# Patient Record
Sex: Female | Born: 1985 | Race: White | Hispanic: No | Marital: Married | Smoking: Never smoker
Health system: Southern US, Community
[De-identification: ages and names within clinical notes are randomized; demographics above are authoritative.]

## PROBLEM LIST (undated history)

## (undated) ENCOUNTER — Inpatient Hospital Stay: Payer: Self-pay

## (undated) DIAGNOSIS — IMO0001 Reserved for inherently not codable concepts without codable children: Secondary | ICD-10-CM

## (undated) DIAGNOSIS — I10 Essential (primary) hypertension: Secondary | ICD-10-CM

## (undated) DIAGNOSIS — Q359 Cleft palate, unspecified: Secondary | ICD-10-CM

## (undated) DIAGNOSIS — Z23 Encounter for immunization: Secondary | ICD-10-CM

## (undated) DIAGNOSIS — F952 Tourette's disorder: Secondary | ICD-10-CM

## (undated) DIAGNOSIS — J45909 Unspecified asthma, uncomplicated: Secondary | ICD-10-CM

## (undated) DIAGNOSIS — Z8742 Personal history of other diseases of the female genital tract: Secondary | ICD-10-CM

## (undated) HISTORY — DX: Cleft palate, unspecified: Q35.9

## (undated) HISTORY — DX: Tourette's disorder: F95.2

## (undated) HISTORY — PX: MYRINGOTOMY: SUR874

## (undated) HISTORY — DX: Personal history of other diseases of the female genital tract: Z87.42

## (undated) HISTORY — DX: Reserved for inherently not codable concepts without codable children: IMO0001

## (undated) HISTORY — DX: Encounter for immunization: Z23

## (undated) HISTORY — DX: Unspecified asthma, uncomplicated: J45.909

## (undated) HISTORY — DX: Essential (primary) hypertension: I10

---
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
## 2007-01-28 ENCOUNTER — Emergency Department: Payer: Self-pay | Admitting: Unknown Physician Specialty

## 2007-06-04 ENCOUNTER — Emergency Department: Payer: Self-pay | Admitting: Unknown Physician Specialty

## 2007-10-16 ENCOUNTER — Emergency Department: Payer: Self-pay | Admitting: Internal Medicine

## 2007-11-24 HISTORY — PX: COLPOSCOPY: SHX161

## 2008-02-29 ENCOUNTER — Ambulatory Visit: Payer: Self-pay | Admitting: Family Medicine

## 2008-02-29 DIAGNOSIS — R42 Dizziness and giddiness: Secondary | ICD-10-CM

## 2008-03-01 ENCOUNTER — Encounter: Payer: Self-pay | Admitting: Family Medicine

## 2008-03-07 DIAGNOSIS — F952 Tourette's disorder: Secondary | ICD-10-CM

## 2008-03-21 ENCOUNTER — Ambulatory Visit: Payer: Self-pay | Admitting: Family Medicine

## 2008-03-21 DIAGNOSIS — J069 Acute upper respiratory infection, unspecified: Secondary | ICD-10-CM | POA: Insufficient documentation

## 2008-03-21 LAB — CONVERTED CEMR LAB: Rapid Strep: NEGATIVE

## 2009-01-18 ENCOUNTER — Ambulatory Visit: Payer: Self-pay | Admitting: Internal Medicine

## 2009-01-18 LAB — CONVERTED CEMR LAB: Rapid Strep: NEGATIVE

## 2010-06-03 ENCOUNTER — Ambulatory Visit: Payer: Self-pay | Admitting: Unknown Physician Specialty

## 2010-08-04 ENCOUNTER — Emergency Department: Payer: Self-pay | Admitting: Emergency Medicine

## 2012-07-20 ENCOUNTER — Encounter: Payer: Self-pay | Admitting: Family Medicine

## 2012-07-20 ENCOUNTER — Ambulatory Visit (INDEPENDENT_AMBULATORY_CARE_PROVIDER_SITE_OTHER): Payer: 59 | Admitting: Family Medicine

## 2012-07-20 ENCOUNTER — Ambulatory Visit (INDEPENDENT_AMBULATORY_CARE_PROVIDER_SITE_OTHER)
Admission: RE | Admit: 2012-07-20 | Discharge: 2012-07-20 | Disposition: A | Discharge status: Home / Self Care | Payer: 59 | Source: Ambulatory Visit | Attending: Family Medicine | Admitting: Family Medicine

## 2012-07-20 VITALS — BP 92/40 | HR 60 | Temp 98.4°F | Ht 62.0 in | Wt 163.0 lb

## 2012-07-20 DIAGNOSIS — R0600 Dyspnea, unspecified: Secondary | ICD-10-CM

## 2012-07-20 DIAGNOSIS — R0989 Other specified symptoms and signs involving the circulatory and respiratory systems: Secondary | ICD-10-CM

## 2012-07-20 DIAGNOSIS — R42 Dizziness and giddiness: Secondary | ICD-10-CM

## 2012-07-20 MED ORDER — POCKET SPACER DEVI: Status: DC

## 2012-07-20 MED ORDER — ALBUTEROL SULFATE HFA 108 (90 BASE) MCG/ACT IN AERS: 2.0000 | INHALATION_SPRAY | Freq: Four times a day (QID) | RESPIRATORY_TRACT | Status: DC | PRN

## 2012-07-20 NOTE — Progress Notes (Signed)
  Subjective:    Patient ID: Caroline Fletcher, female    DOB: 12/26/1985, 26 y.o.   MRN: [id number]  HPI CC: SOB  Noticing shortness of breath, trouble taking deep breaths.  Yawning eventually helps.  Affects her more when trying to fall asleep.  Going on for at least the last year, recently getting worse over last 6 months.  Some heaviness in chest.  Occasional dizziness.  Has been on higher dose of clonidine for management of tourette's by neurology since 01/2012. BP Readings from Last 3 Encounters:  07/20/12 92/40  01/18/09 100/70  03/21/08 120/60    No recent viral infections, coughing, chest pain, LOC, leg swelling, HA.  No wheezing.  No GERD or heartburn.  No ST.  Did go to beach last weekend.  No recent travel outside of country.  Not on hormonal meds.   Some second hand exposure. Does not snore.  Denies h/o apneic episodes.  Does feel rested in am.  Activity: walks on treadmill.  No DOE with this. Works at [employer].    [employer] from Last 3 Encounters:  07/20/12 163 lb (73.936 kg)  01/18/09 148 lb (67.132 kg)  03/21/08 142 lb (64.411 kg)   Medications and allergies reviewed and updated in chart.  Past histories reviewed and updated if relevant as below. Patient Active Problem List  Diagnosis  . TOURETTE'S DISORDER (DR. HICKLING)  . URI  . DIZZINESS   Past Medical History  Diagnosis Date  . Tourette's disorder     Dr. Sandria Manly   Past Surgical History  Procedure Date  . Myringotomy     bilateral  . Pallet repair 1990   History  Substance Use Topics  . Smoking status: Never Smoker   . Smokeless tobacco: Never Used  . Alcohol Use: Yes     Occasional   Family History  Problem Relation Age of Onset  . Healthy Father   . Healthy Mother   . Diabetes Maternal Grandfather   . Stroke Paternal Grandmother   . Heart attack Paternal Grandmother   . Cancer Neg Hx    No Known Allergies Current Outpatient Prescriptions on File Prior to Visit  Medication Sig Dispense  Refill  . cloNIDine (CATAPRES) 0.2 MG tablet Take 0.2 mg by mouth 2 (two) times daily.         Review of Systems Per HPI    Objective:   Physical Exam  Nursing note and vitals reviewed. Constitutional: She appears well-developed and well-nourished. No distress.  HENT:  Head: Normocephalic and atraumatic.  Mouth/Throat: Oropharynx is clear and moist. No oropharyngeal exudate.  Eyes: Conjunctivae and EOM are normal. Pupils are equal, round, and reactive to light. No scleral icterus.  Neck: Normal range of motion. Neck supple. Carotid bruit is not present. No thyromegaly present.  Cardiovascular: Normal rate, regular rhythm, normal heart sounds and intact distal pulses.   No murmur heard. Pulmonary/Chest: Effort normal and breath sounds normal. No respiratory distress. She has no wheezes. She has no rales.  Musculoskeletal: She exhibits no edema.  Lymphadenopathy:    She has no cervical adenopathy.  Skin: Skin is warm and dry.  Psychiatric: She has a normal mood and affect.       Assessment & Plan:

## 2012-07-20 NOTE — Assessment & Plan Note (Addendum)
Given longstanding, check CXR today - clear on my read. Will treat as possible reactive airways although no wheezing on exam today - trial of albuterol inhaler with spacer.  Discussed side effects to watch for. No risk factors for PE.  Some second hand exposure but no h/o asthma, exam WNL today, O2 sat 97%. ?contribution of hypotension from clonidine (has been on for 15 years) - recommended decrease to 1 in am and 1/2 in pm.  Has f/u with neuro 09/2012. Denies possibility of pregnancy. No evidence of cardiac etiology. Update if not improving for further evaluation (consider blood work).

## 2012-07-20 NOTE — Assessment & Plan Note (Signed)
Anticipate due to hypotension - decrease clonidine.

## 2012-07-20 NOTE — Patient Instructions (Addendum)
Chest xray today. We will do trial of albuterol with spacer.  Use as needed for shortness of breath. If not improving, let us know for further evaluation. I do think the low blood pressure may be contributing - I'd like [place] to try and back down on clonidine to 1 pill in am and 1/2 pill in evenings. Let us know how your'e doing.

## 2012-08-17 ENCOUNTER — Ambulatory Visit (INDEPENDENT_AMBULATORY_CARE_PROVIDER_SITE_OTHER): Payer: 59 | Admitting: Family Medicine

## 2012-08-17 ENCOUNTER — Encounter: Payer: Self-pay | Admitting: Family Medicine

## 2012-08-17 VITALS — BP 110/70 | HR 68 | Temp 98.0°F | Wt 155.0 lb

## 2012-08-17 DIAGNOSIS — N912 Amenorrhea, unspecified: Secondary | ICD-10-CM

## 2012-08-17 DIAGNOSIS — R55 Syncope and collapse: Secondary | ICD-10-CM

## 2012-08-17 DIAGNOSIS — R42 Dizziness and giddiness: Secondary | ICD-10-CM

## 2012-08-17 LAB — POCT URINE PREGNANCY: Preg Test, Ur: NEGATIVE

## 2012-08-17 NOTE — Patient Instructions (Addendum)
It was great to meet you. We are ordering some blood work today. Your blood pressure is much better with the lower dose of clonidine. We will call you with your lab results as soon as I get them.

## 2012-08-17 NOTE — Progress Notes (Signed)
Subjective:    Patient ID: Caroline Fletcher, female    DOB: December 14, 1985, 26 y.o.   MRN: [id number]  HPI 26 yo pleasant female pt of Dr. B, new to me here for "blacked out."  Felt nauseated and blacked out yesterday at work.  She did not vomit.  She did not hit her head.  It was witnessed by several of her co workers who stated that she immediately woke up but was pale. No seizure activity noted. EMS was called- per pt- BP was 110/70, CBG normal.  Has had not recurrent episodes like this since.  Saw Dr. Reece Agar last month for SOB and dizziness.  Note reviewed. Was hypotensive on clonidine- Dr. Reece Agar decreased dose of clonidine. Had been on higher dose of clonidine for management of tourette's by neurology since 01/2012.  Normotensive today. BP Readings from Last 3 Encounters:  08/17/12 110/70  07/20/12 92/40  01/18/09 100/70    SOB has been occuring mainly at night but that seems to improve.   CXR was neg.  Given albuterol inhaler to see if would help with symptoms.  She has not used this often.  No recent viral infections, NO CP.  No palpitations.  No recent travel.  Not on hormonal meds.   Does not snore.    Saw her neurologist, Dr. Sandria Manly on Monday who recommended Melotinin. She is not sure if this works yet as she has only taken it once.    Wt Readings from Last 3 Encounters:  08/17/12 155 lb (70.308 kg)  07/20/12 163 lb (73.936 kg)  01/18/09 148 lb (67.132 kg)   Patient Active Problem List  Diagnosis  . TOURETTE'S DISORDER (DR. HICKLING)  . DIZZINESS  . Dyspnea   Past Medical History  Diagnosis Date  . Tourette's disorder     Dr. Sandria Manly   Past Surgical History  Procedure Date  . Myringotomy     bilateral  . Pallet repair 1990   History  Substance Use Topics  . Smoking status: Never Smoker   . Smokeless tobacco: Never Used  . Alcohol Use: Yes     Occasional   Family History  Problem Relation Age of Onset  . Healthy Father   . Healthy Mother   . Diabetes Maternal  Grandfather   . Stroke Paternal Grandmother   . Heart attack Paternal Grandmother   . Cancer Neg Hx    No Known Allergies Current Outpatient Prescriptions on File Prior to Visit  Medication Sig Dispense Refill  . albuterol (PROVENTIL HFA;VENTOLIN HFA) 108 (90 BASE) MCG/ACT inhaler Inhale 2 puffs into the lungs every 6 (six) hours as needed for wheezing.  1 Inhaler  2  . cloNIDine (CATAPRES) 0.2 MG tablet Take 0.2 mg by mouth. 1 in am and 1/2 in pm      . Spacer/Aero-Holding Chambers (POCKET SPACER) DEVI As directed  1 each  0    The PMH, PSH, Social History, Family History, Medications, and allergies have been reviewed in [facility], and have been updated if relevant.   Review of Systems Per HPI    Objective:   Physical Exam  BP 110/70  Pulse 68  Temp 98 F (36.7 C)  Wt 155 lb (70.308 kg)  LMP 07/06/2012  Constitutional: She appears well-developed and well-nourished. No distress.  HENT:  Head: Normocephalic and atraumatic.  Mouth/Throat: Oropharynx is clear and moist. No oropharyngeal exudate.  Eyes: Conjunctivae and EOM are normal. Pupils are equal, round, and reactive to light. No scleral icterus.  Neck:  Normal range of motion. Neck supple. Carotid bruit is not present. No thyromegaly present.  Cardiovascular: Normal rate, regular rhythm, normal heart sounds and intact distal pulses.   No murmur heard. Pulmonary/Chest: Effort normal and breath sounds normal. No respiratory distress. She has no wheezes. She has no rales.  Musculoskeletal: She exhibits no edema.  Lymphadenopathy:    She has no cervical adenopathy.  Skin: Skin is warm and dry.  Psychiatric: She has a normal mood and affect.       Assessment & Plan:     1. Syncope  New- one episode yesterday but has had recurrent episodes of dizziness with unclear etiology. Now normotensive. U preg neg and low risk for DVT. SOB improved but still having episodes at night- ? PND EKG NSR with PACs. Will check blood work  today.  If all neg- consider ?anxiety or Holter monitor. The patient indicates understanding of these issues and agrees with the plan.  TSH, T4, Free, CBC with Differential, Comprehensive metabolic panel  2. Amenorrhea  POCT urine pregnancy

## 2012-08-18 LAB — COMPREHENSIVE METABOLIC PANEL
ALT: 11 IU/L (ref 0–32)
Albumin/Globulin Ratio: 1.7 (ref 1.1–2.5)
Calcium: 10.2 mg/dL (ref 8.7–10.2)
Creatinine, Ser: 0.8 mg/dL (ref 0.57–1.00)
GFR calc non Af Amer: 104 mL/min/{1.73_m2} (ref >59)
Glucose: 61 mg/dL — ABNORMAL LOW (ref 65–99)
Potassium: 3.9 mmol/L (ref 3.5–5.2)
Total Protein: 7.3 g/dL (ref 6.0–8.5)

## 2012-08-18 LAB — CBC WITH DIFFERENTIAL/PLATELET
Basos: 1 % (ref 0–3)
Eos: 1 % (ref 0–5)
Eosinophils Absolute: 0.1 10*3/uL (ref 0.0–0.4)
Immature Grans (Abs): 0 10*3/uL (ref 0.0–0.1)
Lymphs: 24 % (ref 14–46)
Neutrophils Relative %: 65 % (ref 40–74)
RBC: 4.5 x10E6/uL (ref 3.77–5.28)
WBC: 7.7 10*3/uL (ref 3.4–10.8)

## 2012-08-18 LAB — T4, FREE: Free T4: 1.22 ng/dL (ref 0.82–1.77)

## 2012-08-18 LAB — TSH: TSH: 2.27 u[IU]/mL (ref 0.450–4.500)

## 2012-11-23 DIAGNOSIS — Z8742 Personal history of other diseases of the female genital tract: Secondary | ICD-10-CM

## 2012-11-23 HISTORY — DX: Personal history of other diseases of the female genital tract: Z87.42

## 2012-11-30 ENCOUNTER — Emergency Department: Payer: Self-pay

## 2012-11-30 LAB — CBC
HCT: 37.1 % (ref 35.0–47.0)
HGB: 12.9 g/dL (ref 12.0–16.0)
Platelet: 289 10*3/uL (ref 150–440)
RBC: 4.41 10*6/uL (ref 3.80–5.20)
RDW: 12.3 % (ref 11.5–14.5)
WBC: 7 10*3/uL (ref 3.6–11.0)

## 2012-11-30 LAB — BASIC METABOLIC PANEL
Calcium, Total: 8.9 mg/dL (ref 8.5–10.1)
Co2: 27 mmol/L (ref 21–32)
EGFR (Non-African Amer.): >60
Glucose: 96 mg/dL (ref 65–99)
Potassium: 3.6 mmol/L (ref 3.5–5.1)

## 2013-03-03 ENCOUNTER — Other Ambulatory Visit: Payer: Self-pay

## 2013-03-03 ENCOUNTER — Encounter: Payer: Self-pay | Admitting: Family Medicine

## 2013-03-03 ENCOUNTER — Ambulatory Visit (INDEPENDENT_AMBULATORY_CARE_PROVIDER_SITE_OTHER): Payer: 59 | Admitting: Family Medicine

## 2013-03-03 VITALS — BP 100/60 | HR 71 | Temp 98.1°F | Ht 62.0 in | Wt 150.0 lb

## 2013-03-03 DIAGNOSIS — F952 Tourette's disorder: Secondary | ICD-10-CM

## 2013-03-03 MED ORDER — CLONAZEPAM 0.5 MG PO TABS: 0.5000 mg | ORAL_TABLET | Freq: Two times a day (BID) | ORAL | Status: DC

## 2013-03-03 NOTE — Telephone Encounter (Signed)
Former [facility] patient calling to request refills on Klonopin.  She has been assigned to Dr Frances Furbish.

## 2013-03-03 NOTE — Assessment & Plan Note (Signed)
Stable on clonazepam and clonidine.  Refilled clonazepam to last until appt with new neurologist.  Discussed stress reduction, relaxation.

## 2013-03-03 NOTE — Progress Notes (Signed)
  Subjective:    Patient ID: Caroline Fletcher, female    DOB: [birth year], 27 y.o.   MRN: [id number]  HPI 27 year old female presents to discuss medication refills. She had Tourette's syndrome that was previously treated with  Clonazepam 0.5 mg twice daily by Neuro Dr. Sandria Manly. He retired 3 weeks ago and she has an appt on May 9th with Dr. Herbie Drape at [facility].  She started on clonazepam 3 months ago when she was seen at [facility].. For Tourette's attack, anxiety, SOB. Negative evaluation. She is also maintained on clonidine.  Since she has been on clonazepam it helps with ticks, SOB and resulting anxiety. She is not having any SE to this med and feels much better overall on it. No depression, No SI.  Last CPX with GYN in last year.   Review of Systems  Constitutional: Negative for fever and fatigue.  HENT: Negative for ear pain.   Eyes: Negative for pain.  Respiratory: Negative for chest tightness and shortness of breath.   Cardiovascular: Negative for chest pain, palpitations and leg swelling.  Gastrointestinal: Negative for abdominal pain.  Genitourinary: Negative for dysuria.       Objective:   Physical Exam  Constitutional: Vital signs are normal. She appears well-developed and well-nourished. She is cooperative.  Non-toxic appearance. She does not appear ill. No distress.  HENT:  Head: Normocephalic.  Right Ear: Hearing, tympanic membrane, external ear and ear canal normal. Tympanic membrane is not erythematous, not retracted and not bulging.  Left Ear: Hearing, tympanic membrane, external ear and ear canal normal. Tympanic membrane is not erythematous, not retracted and not bulging.  Nose: No mucosal edema or rhinorrhea. Right sinus exhibits no maxillary sinus tenderness and no frontal sinus tenderness. Left sinus exhibits no maxillary sinus tenderness and no frontal sinus tenderness.  Mouth/Throat: Uvula is midline, oropharynx is clear and moist and mucous membranes are normal.  Eyes:  Conjunctivae, EOM and lids are normal. Pupils are equal, round, and reactive to light. No foreign bodies found.  Neck: Trachea normal and normal range of motion. Neck supple. Carotid bruit is not present. No mass and no thyromegaly present.  Cardiovascular: Normal rate, regular rhythm, S1 normal, S2 normal, normal heart sounds, intact distal pulses and normal pulses.  Exam reveals no gallop and no friction rub.   No murmur heard. Pulmonary/Chest: Effort normal and breath sounds normal. Not tachypneic. No respiratory distress. She has no decreased breath sounds. She has no wheezes. She has no rhonchi. She has no rales.  Abdominal: Soft. Normal appearance and bowel sounds are normal. There is no tenderness.  Neurological: She is alert.  Skin: Skin is warm, dry and intact. No rash noted.  Psychiatric: Her speech is normal and behavior is normal. Judgment and thought content normal. Her mood appears not anxious. Cognition and memory are normal. She does not exhibit a depressed mood.          Assessment & Plan:

## 2013-03-03 NOTE — Patient Instructions (Signed)
Keep appointment as planned with [facility] Neurologist.

## 2013-03-07 NOTE — Telephone Encounter (Signed)
It appears Dr Pattricia Boss ordered #60 Klonopin on 03/03/13

## 2013-04-20 ENCOUNTER — Encounter: Payer: Self-pay | Admitting: Family Medicine

## 2013-04-20 ENCOUNTER — Ambulatory Visit (INDEPENDENT_AMBULATORY_CARE_PROVIDER_SITE_OTHER): Payer: 59 | Admitting: Family Medicine

## 2013-04-20 VITALS — BP 120/60 | HR 80 | Temp 98.2°F | Ht 62.0 in | Wt 146.0 lb

## 2013-04-20 DIAGNOSIS — J020 Streptococcal pharyngitis: Secondary | ICD-10-CM

## 2013-04-20 DIAGNOSIS — J029 Acute pharyngitis, unspecified: Secondary | ICD-10-CM

## 2013-04-20 LAB — POCT RAPID STREP A (OFFICE): Rapid Strep A Screen: POSITIVE — AB

## 2013-04-20 MED ORDER — AMOXICILLIN 875 MG PO TABS: 875.0000 mg | ORAL_TABLET | Freq: Two times a day (BID) | ORAL | Status: DC

## 2013-04-20 NOTE — Progress Notes (Signed)
    Nature conservation officer at [facility] [address] [place] [address] Phone: [phone] Fax: [phone]  Date:  04/20/2013   Name:  Caroline Fletcher   DOB:  May 07, 1986   MRN:  [id number] Gender: female Age: 27 y.o.  PCP:  Kerby Nora, MD  Evaluating MD: Hannah Beat, MD  History of Present Illness:  Patient presents with sore throat for 3 days. Subjective fevers, achiness, headache. Some nausea. No significant URI sx. No significant cough.  The PMH, PSH, Social History, Family History, Medications, and allergies have been reviewed in [facility], and have been updated if relevant.   ROS: GEN: Acute illness details above GI: Tolerating PO intake GU: maintaining adequate hydration and urination Pulm: No SOB Interactive and getting along well at home.  Otherwise, ROS is as per the HPI.   Physical Exam  Filed Vitals:   04/20/13 1200  BP: 120/60  Pulse: 80  Temp: 98.2 F (36.8 C)  TempSrc: Oral  Height: 5\' 2"  (1.575 m)  Weight: 146 lb (66.225 kg)  SpO2: 97%    Gen: WDWN, NAD; A & O x3, cooperative. Pleasant.Globally Non-toxic HEENT: Normocephalic and atraumatic. Throat: swollen tonsills with exudate R TM clear, L TM - good landmarks, No fluid present. rhinnorhea. No frontal or maxillary sinus T. MMM NECK: Anterior cervical  LAD is present - TTP CV: RRR, No M/G/R, cap refill <2 sec PULM: Breathing comfortably in no respiratory distress. no wheezing, crackles, rhonchi EXT: No c/c/e PSYCH: Friendly, good eye contact MSK: Nml gait   A/P: Strep Throat Strep test + Treat with ABX Supportive care

## 2013-06-14 ENCOUNTER — Ambulatory Visit (INDEPENDENT_AMBULATORY_CARE_PROVIDER_SITE_OTHER): Payer: 59 | Admitting: Family Medicine

## 2013-06-14 ENCOUNTER — Encounter: Payer: Self-pay | Admitting: Family Medicine

## 2013-06-14 VITALS — BP 100/60 | HR 68 | Temp 97.9°F | Ht 62.0 in | Wt 150.0 lb

## 2013-06-14 DIAGNOSIS — J45909 Unspecified asthma, uncomplicated: Secondary | ICD-10-CM

## 2013-06-14 DIAGNOSIS — J4540 Moderate persistent asthma, uncomplicated: Secondary | ICD-10-CM

## 2013-06-14 DIAGNOSIS — R0602 Shortness of breath: Secondary | ICD-10-CM

## 2013-06-14 MED ORDER — MOMETASONE FUROATE 220 MCG/INH IN AEPB: 1.0000 | INHALATION_SPRAY | Freq: Every day | RESPIRATORY_TRACT | Status: DC

## 2013-06-14 NOTE — Patient Instructions (Signed)
F/u 4-6 weeks with Dr. Patsy Lager

## 2013-06-14 NOTE — Progress Notes (Signed)
Nature conservation officer at [facility] [address] [place] [address] Phone: [phone] Fax: [phone]  Date:  06/14/2013   Name:  Prince Solian   DOB:  August 08, 1986   MRN:  [id number] Gender: female Age: 27 y.o.  Primary Physician:  Kerby Nora, MD  Evaluating MD: Hannah Beat, MD   Chief Complaint: Cough and Shortness of Breath   History of Present Illness:  Prince Solian is a 27 y.o. pleasant patient who presents with the following:  Pleasant young lady with Tourette's:  Heavy chest - a year ago, and will get an inhaler. Feels like cannot breath well. The albuterol and spacer will help a little bit.  Works at [employer].  No smoking.  She feels short of breath and has difficulty breathing with exercise, going up and down stairs, when it is damp, when it is really hot.   Patient Active Problem List   Diagnosis Date Noted  . Syncope 08/17/2012  . Dyspnea 07/20/2012  . TOURETTE'S DISORDER (DR. HICKLING) 03/07/2008  . DIZZINESS 02/29/2008    Past Medical History  Diagnosis Date  . Tourette's disorder     Dr. Sandria Manly    Past Surgical History  Procedure Laterality Date  . Myringotomy      bilateral  . Pallet repair  1990    History   Social History  . Marital Status: Single    Spouse Name: N/A    Number of Children: N/A  . Years of Education: N/A   Occupational History  . Not on file.   Social History Main Topics  . Smoking status: Never Smoker   . Smokeless tobacco: Never Used  . Alcohol Use: Yes     Comment: Occasional  . Drug Use: No  . Sexually Active: Not on file   Other Topics Concern  . Not on file   Social History Narrative  . No narrative on file    Family History  Problem Relation Age of Onset  . Healthy Father   . Healthy Mother   . Diabetes Maternal Grandfather   . Stroke Paternal Grandmother   . Heart attack Paternal Grandmother   . Cancer Neg Hx     No Known Allergies  Medication list has been reviewed and  updated.  Outpatient Prescriptions Prior to Visit  Medication Sig Dispense Refill  . clonazePAM (KLONOPIN) 0.5 MG tablet Take 1 tablet (0.5 mg total) by mouth 2 (two) times daily.  60 tablet  0  . cloNIDine (CATAPRES) 0.2 MG tablet Take 0.2 mg by mouth. 1 in am and 1/2 in pm      . albuterol (PROVENTIL HFA;VENTOLIN HFA) 108 (90 BASE) MCG/ACT inhaler Inhale 2 puffs into the lungs every 6 (six) hours as needed for wheezing.  1 Inhaler  2  . amoxicillin (AMOXIL) 875 MG tablet Take 1 tablet (875 mg total) by mouth 2 (two) times daily.  20 tablet  0  . Spacer/Aero-Holding Chambers (POCKET SPACER) DEVI As directed  1 each  0   No facility-administered medications prior to visit.    Review of Systems:  Dyspnea and SOB as above. No reflux / heartburn appreciable. No fever, chills, sweats.  Physical Examination: BP 100/60  Pulse 68  Temp(Src) 97.9 F (36.6 C) (Oral)  Ht 5\' 2"  (1.575 m)  Wt 150 lb (68.04 kg)  BMI 27.43 kg/m2  SpO2 98%  Ideal Body Weight: Weight in (lb) to have BMI = 25: 136.4   GEN: WDWN, NAD, Non-toxic, A & O  x 3 HEENT: Atraumatic, Normocephalic. Neck supple. No masses, No LAD. Ears and Nose: No external deformity. CV: RRR, No M/G/R. No JVD. No thrill. No extra heart sounds. PULM: CTA B, no wheezes, crackles, rhonchi. No retractions. No resp. distress. No accessory muscle use. EXTR: No c/c/e NEURO Normal gait.  PSYCH: Normally interactive. Conversant. Not depressed or anxious appearing.  Calm demeanor.    Spirometry: Pre: FVC: 3.00 FEV1: 2.47 FEV1/FVC: 83% FEF 25-75: 2.62  Post-albuterol: FVC: 3.04 FEV1: 2.8 FEV1/FVC: 92% FEF 25-75: 3.65  Assessment and Plan:  SOB (shortness of breath) - Plan: Spirometry w/ graph, Spirometry w/ graph  Asthma, chronic, moderate persistent, uncomplicated  C/w asthma clinically, and multiple aspects of spirometry improve with bronchodilator, c/w asthma.  Start low-dose asmanex and f/u in 1 moonth.  Orders Today:    Orders Placed This Encounter  Procedures  . Spirometry w/ graph    Standing Status: Future     Number of Occurrences: 1     Standing Expiration Date: 06/14/2014    Updated Medication List: (Includes new medications, updates to list, dose adjustments) Meds ordered this encounter  Medications  . mometasone (ASMANEX 60 METERED DOSES) 220 MCG/INH inhaler    Sig: Inhale 1 puff into the lungs daily.    Dispense:  1 Inhaler    Refill:  5    Medications Discontinued: Medications Discontinued During This Encounter  Medication Reason  . albuterol (PROVENTIL HFA;VENTOLIN HFA) 108 (90 BASE) MCG/ACT inhaler Error  . amoxicillin (AMOXIL) 875 MG tablet Error  . Spacer/Aero-Holding Chambers (POCKET [employer]) DEVI Error      Signed, Karleen Hampshire T. Jabez Molner, MD 06/14/2013 12:21 PM

## 2013-06-15 ENCOUNTER — Encounter: Payer: Self-pay | Admitting: Family Medicine

## 2013-06-15 DIAGNOSIS — J45909 Unspecified asthma, uncomplicated: Secondary | ICD-10-CM

## 2013-06-15 HISTORY — DX: Unspecified asthma, uncomplicated: J45.909

## 2013-07-19 ENCOUNTER — Ambulatory Visit: Payer: 59 | Admitting: Family Medicine

## 2013-07-19 DIAGNOSIS — Z0289 Encounter for other administrative examinations: Secondary | ICD-10-CM

## 2013-07-25 ENCOUNTER — Other Ambulatory Visit: Payer: Self-pay | Admitting: Family Medicine

## 2013-07-25 NOTE — Telephone Encounter (Signed)
Is Nehemiah Settle suppose to be on Proventil and Asamanex?  Proventil is no longer on her medication list. Last OV 06/14/2013 Ok to refill?

## 2013-07-26 NOTE — Telephone Encounter (Signed)
Yes, 1 albuterol inhaler, 2 puffs every 4 hours as needed. #3 refills

## 2013-10-25 ENCOUNTER — Ambulatory Visit (INDEPENDENT_AMBULATORY_CARE_PROVIDER_SITE_OTHER): Payer: 59 | Admitting: Family Medicine

## 2013-10-25 VITALS — BP 110/70 | HR 67 | Temp 98.5°F | Ht 62.0 in | Wt 162.5 lb

## 2013-10-25 DIAGNOSIS — J45909 Unspecified asthma, uncomplicated: Secondary | ICD-10-CM

## 2013-10-25 DIAGNOSIS — F952 Tourette's disorder: Secondary | ICD-10-CM

## 2013-10-25 DIAGNOSIS — J209 Acute bronchitis, unspecified: Secondary | ICD-10-CM

## 2013-10-25 MED ORDER — DOXYCYCLINE HYCLATE 100 MG PO TABS: 100.0000 mg | ORAL_TABLET | Freq: Two times a day (BID) | ORAL | Status: DC

## 2013-10-25 NOTE — Progress Notes (Signed)
Pre-visit discussion using our clinic review tool. No additional management support is needed unless otherwise documented below in the visit note.  

## 2013-10-25 NOTE — Progress Notes (Signed)
Patient Name: Caroline Fletcher Date of Birth: 10-14-1986 Medical Record Number: [id number]  History of Present Illness:  Patent presents with runny nose, sneezing, cough, sore throat, malaise and minimal / low-grade fever .  Cold-like sx, coughing, sneezing. Head congestion. No wheezing.   Off Klonopin. Sees Dr. Ivette Loyal and [place]. Her ticks have gotten significantly worse since she has been sick.  ? recent exposure to others with similar symptoms.   The patent denies sore throat as the primary complaint. Denies sthortness of breath/wheezing, high fever, chest pain, rhinits for more than 14 days, significant myalgia, otalgia, facial pain, abdominal pain, changes in bowel or bladder.  PMH, PHS, Allergies, Problem List, Medications, Family History, and Social History have all been reviewed.  Patient Active Problem List   Diagnosis Date Noted  . Asthma, chronic 06/15/2013  . TOURETTE'S DISORDER ([facility]) 03/07/2008    Past Medical History  Diagnosis Date  . Tourette's disorder     Dr. Sandria Manly  . Asthma, chronic 06/15/2013    Past Surgical History  Procedure Laterality Date  . Myringotomy      bilateral  . Pallet repair  1990    History   Social History  . Marital Status: Single    Spouse Name: N/A    Number of Children: N/A  . Years of Education: N/A   Occupational History  . Not on file.   Social History Main Topics  . Smoking status: Never Smoker   . Smokeless tobacco: Never Used  . Alcohol Use: Yes     Comment: Occasional  . Drug Use: No  . Sexual Activity: Not on file   Other Topics Concern  . Not on file   Social History Narrative  . No narrative on file    Family History  Problem Relation Age of Onset  . Healthy Father   . Healthy Mother   . Diabetes Maternal Grandfather   . Stroke Paternal Grandmother   . Heart attack Paternal Grandmother   . Cancer Neg Hx     No Known Allergies  Medication list reviewed and updated in full in [facility].  Review of Systems: as above, eating and drinking - tolerating PO. Urinating normally. No excessive vomitting or diarrhea. O/w as above.  Physical Exam:  Filed Vitals:   10/25/13 1140  BP: 110/70  Pulse: 67  Temp: 98.5 F (36.9 C)  TempSrc: Oral  Height: 5\' 2"  (1.575 m)  Weight: 162 lb 8 oz (73.71 kg)    GEN: WDWN, Non-toxic, Atraumatic, normocephalic. A and O x 3. HEENT: Oropharynx clear without exudate, MMM, no significant LAD, mild rhinnorhea Ears: TM clear, COL visualized with good landmarks CV: RRR, no m/g/r. Pulm: CTA B, no wheezes, rhonchi, or crackles, normal respiratory effort. EXT: no c/c/e Psych: well oriented, neither depressed nor anxious in appearance  Objective Data:   Acute bronchitis  TOURETTE'S DISORDER ([facility])  Asthma, chronic  Is difficult to tell if this is a viral or bacterial pathogen. In this patient with asthma and worsening Tourette's, think that the potential benefit outweigh the risks,, place her on some doxycycline.  New medications, updates to list, dose adjustments: Meds ordered this encounter  Medications  . tetrabenazine (XENAZINE) 12.5 MG tablet    Sig: Take 12.5 mg by mouth 2 (two) times daily.  Marland Kitchen doxycycline (VIBRA-TABS) 100 MG tablet    Sig: Take 1 tablet (100 mg total) by mouth 2 (two) times daily.    Dispense:  20 tablet  Refill:  0    Signed,  Chandria Rookstool T. Madisan Bice, MD, CAQ Sports Medicine  [facility] [address] [place] [address] Phone: [phone] Fax: [phone]  Updated Complete Medication List:   Medication List       This list is accurate as of: 10/25/13 11:59 PM.  Always use your most recent med list.               albuterol 108 (90 BASE) MCG/ACT inhaler  Commonly known as:  PROVENTIL HFA  Inhale 2 puffs into the lungs every 4 (four) hours as needed for wheezing.     cloNIDine 0.2 MG tablet  Commonly known as:  CATAPRES  Take 0.2 mg by mouth. 1 in  am and 1/2 in pm     doxycycline 100 MG tablet  Commonly known as:  VIBRA-TABS  Take 1 tablet (100 mg total) by mouth 2 (two) times daily.     mometasone 220 MCG/INH inhaler  Commonly known as:  ASMANEX 60 METERED DOSES  Inhale 1 puff into the lungs daily.     tetrabenazine 12.5 MG tablet  Commonly known as:  XENAZINE  Take 12.5 mg by mouth 2 (two) times daily.

## 2013-12-11 ENCOUNTER — Ambulatory Visit (INDEPENDENT_AMBULATORY_CARE_PROVIDER_SITE_OTHER): Payer: 59 | Admitting: Internal Medicine

## 2013-12-11 ENCOUNTER — Encounter: Payer: Self-pay | Admitting: Internal Medicine

## 2013-12-11 VITALS — BP 110/60 | HR 81 | Temp 98.2°F | Wt 161.0 lb

## 2013-12-11 DIAGNOSIS — M549 Dorsalgia, unspecified: Secondary | ICD-10-CM

## 2013-12-11 DIAGNOSIS — R109 Unspecified abdominal pain: Secondary | ICD-10-CM | POA: Insufficient documentation

## 2013-12-11 LAB — POCT URINALYSIS DIPSTICK
Bilirubin, UA: NEGATIVE
Glucose, UA: NEGATIVE
Ketones, UA: NEGATIVE
LEUKOCYTES UA: NEGATIVE
NITRITE UA: NEGATIVE
PROTEIN UA: NEGATIVE
UROBILINOGEN UA: NEGATIVE
pH, UA: 7

## 2013-12-11 NOTE — Patient Instructions (Signed)
Please try heat on the painful area to see if that improves things

## 2013-12-11 NOTE — Assessment & Plan Note (Signed)
Not UTI or cystitis Not GI  Discussed muscular etiology Can try heat, etc

## 2013-12-11 NOTE — Progress Notes (Signed)
   Subjective:    Patient ID: Caroline Fletcher, female    DOB: September 28, 1986, 28 y.o.   MRN: [id number]  HPI Having low back pain for 2 weeks Notices it more at night Wondering about her kidneys --gets urinary urgency No increased frequency No hematuria or burning dysuria  Doesn't feel like a back injury Like "a grab"  Hasn't taken anything for it No history of kidney stone  Current Outpatient Prescriptions on File Prior to Visit  Medication Sig Dispense Refill  . albuterol (PROVENTIL HFA) 108 (90 BASE) MCG/ACT inhaler Inhale 2 puffs into the lungs every 4 (four) hours as needed for wheezing.  8.5 g  3  . cloNIDine (CATAPRES) 0.2 MG tablet Take 0.2 mg by mouth. 1 in am and 1/2 in pm      . tetrabenazine (XENAZINE) 12.5 MG tablet Take 12.5 mg by mouth 2 (two) times daily.       No current facility-administered medications on file prior to visit.    No Known Allergies  Past Medical History  Diagnosis Date  . Tourette's disorder     Dr. Sandria ManlyLove  . Asthma, chronic 06/15/2013    Past Surgical History  Procedure Laterality Date  . Myringotomy      bilateral  . Pallet repair  1990    Family History  Problem Relation Age of Onset  . Healthy Father   . Healthy Mother   . Diabetes Maternal Grandfather   . Stroke Paternal Grandmother   . Heart attack Paternal Grandmother   . Cancer Neg Hx     History   Social History  . Marital Status: Single    Spouse Name: N/A    Number of Children: N/A  . Years of Education: N/A   Occupational History  . Not on file.   Social History Main Topics  . Smoking status: Never Smoker   . Smokeless tobacco: Never Used  . Alcohol Use: Yes     Comment: Occasional  . Drug Use: No  . Sexual Activity: Not on file   Other Topics Concern  . Not on file   Social History Narrative  . No narrative on file   Review of Systems No fver UTI's in past--never with this symptom On period now Appetite is fine Bowels are okay    Objective:   Physical Exam  Constitutional: She appears well-developed and well-nourished. No distress.  Abdominal: Soft. She exhibits no distension. There is no tenderness. There is no rebound and no guarding.  Musculoskeletal:  No spine tenderness Very slight right flank pain Normal ROM in hips SLR negative bilaterally  Neurological:  Normal gait and strength           Assessment & Plan:

## 2013-12-11 NOTE — Progress Notes (Signed)
Pre-visit discussion using our clinic review tool. No additional management support is needed unless otherwise documented below in the visit note.  

## 2014-01-10 ENCOUNTER — Emergency Department: Payer: Self-pay | Admitting: Emergency Medicine

## 2014-03-26 ENCOUNTER — Encounter: Payer: Self-pay | Admitting: Maternal & Fetal Medicine

## 2014-05-10 ENCOUNTER — Emergency Department: Payer: Self-pay

## 2014-05-10 LAB — COMPREHENSIVE METABOLIC PANEL
ANION GAP: 8 (ref 7–16)
Albumin: 3.6 g/dL (ref 3.4–5.0)
Alkaline Phosphatase: 42 U/L — ABNORMAL LOW
BILIRUBIN TOTAL: 0.3 mg/dL (ref 0.2–1.0)
BUN: 7 mg/dL (ref 7–18)
Calcium, Total: 9.8 mg/dL (ref 8.5–10.1)
Chloride: 102 mmol/L (ref 98–107)
Co2: 25 mmol/L (ref 21–32)
Creatinine: 0.57 mg/dL — ABNORMAL LOW (ref 0.60–1.30)
EGFR (African American): >60
EGFR (Non-African Amer.): >60
GLUCOSE: 73 mg/dL (ref 65–99)
Osmolality: 267 (ref 275–301)
POTASSIUM: 4.2 mmol/L (ref 3.5–5.1)
SGOT(AST): 10 U/L — ABNORMAL LOW (ref 15–37)
SGPT (ALT): 23 U/L (ref 12–78)
SODIUM: 135 mmol/L — AB (ref 136–145)
TOTAL PROTEIN: 8.1 g/dL (ref 6.4–8.2)

## 2014-05-10 LAB — CBC
HCT: 37.9 % (ref 35.0–47.0)
HGB: 13 g/dL (ref 12.0–16.0)
MCH: 28.8 pg (ref 26.0–34.0)
MCHC: 34.3 g/dL (ref 32.0–36.0)
MCV: 84 fL (ref 80–100)
Platelet: 275 10*3/uL (ref 150–440)
RBC: 4.52 10*6/uL (ref 3.80–5.20)
RDW: 12.5 % (ref 11.5–14.5)
WBC: 13.6 10*3/uL — AB (ref 3.6–11.0)

## 2014-06-04 ENCOUNTER — Encounter: Payer: Self-pay | Admitting: Maternal & Fetal Medicine

## 2014-06-18 ENCOUNTER — Encounter: Payer: Self-pay | Admitting: Maternal & Fetal Medicine

## 2014-09-17 ENCOUNTER — Encounter: Payer: Self-pay | Admitting: Maternal & Fetal Medicine

## 2014-10-01 ENCOUNTER — Observation Stay: Payer: Self-pay

## 2014-10-03 ENCOUNTER — Ambulatory Visit (INDEPENDENT_AMBULATORY_CARE_PROVIDER_SITE_OTHER): Payer: 59 | Admitting: Family Medicine

## 2014-10-03 ENCOUNTER — Encounter: Payer: Self-pay | Admitting: Family Medicine

## 2014-10-03 VITALS — BP 108/70 | HR 95 | Temp 98.2°F | Ht 62.0 in | Wt 203.2 lb

## 2014-10-03 DIAGNOSIS — H6592 Unspecified nonsuppurative otitis media, left ear: Secondary | ICD-10-CM

## 2014-10-03 DIAGNOSIS — Z3483 Encounter for supervision of other normal pregnancy, third trimester: Secondary | ICD-10-CM

## 2014-10-03 DIAGNOSIS — Z3493 Encounter for supervision of normal pregnancy, unspecified, third trimester: Secondary | ICD-10-CM

## 2014-10-03 DIAGNOSIS — H6692 Otitis media, unspecified, left ear: Secondary | ICD-10-CM

## 2014-10-03 MED ORDER — AMOXICILLIN 500 MG PO CAPS: 1000.0000 mg | ORAL_CAPSULE | Freq: Two times a day (BID) | ORAL | Status: DC

## 2014-10-03 NOTE — Progress Notes (Signed)
Pre visit review using our clinic review tool, if applicable. No additional management support is needed unless otherwise documented below in the visit note. 

## 2014-10-03 NOTE — Progress Notes (Signed)
   Dr. Karleen HampshireSpencer T. Ishmail Mcmanamon, MD, CAQ Sports Medicine Primary Care and Sports Medicine [address] [place], [id number] Phone: [phone] Fax: [phone]  10/03/2014  Patient: Caroline Fletcher, MRN: [id number], DOB: 14-Feb-1986, 28 y.o.  Primary Physician:  Hannah BeatSpencer Shoji Pertuit, MD  Chief Complaint: Otitis Media  Subjective:   Caroline Fletcher is a 28 y.o. very pleasant female patient who presents with the following:  Left otitis media, hurts during the day and at night. This is been ongoing for 3 days. She has not had much significant URI type symptoms. The patient is pregnant and she is [redacted] weeks pregnant currently. She also has to wisdom teeth are coming in, including one on the left upper.  Past Medical History, Surgical History, Social History, Family History, Problem List, Medications, and Allergies have been reviewed and updated if relevant.  ROS: GEN: Acute illness details above GI: Tolerating PO intake GU: maintaining adequate hydration and urination Pulm: No SOB Interactive and getting along well at home.  Otherwise, ROS is as per the HPI.   Objective:   BP 108/70 mmHg  Pulse 95  Temp(Src) 98.2 F (36.8 C) (Oral)  Ht 5\' 2"  (1.575 m)  Wt 203 lb 4 oz (92.194 kg)  BMI 37.17 kg/m2   Gen: WDWN, NAD; A & O x3, cooperative. Pleasant.Globally Non-toxic HEENT: Normocephalic and atraumatic. Throat clear, w/o exudate, R TM clear, L TM - poor landmarks, + fluid present. No rhinnorhea.  MMM Frontal sinuses: NT Max sinuses: NT NECK: Anterior cervical  LAD is absent CV: RRR, No M/G/R, cap refill <2 sec PULM: Breathing comfortably in no respiratory distress. no wheezing, crackles, rhonchi EXT: No c/c/e PSYCH: Friendly, good eye contact MSK: Nml gait     Laboratory and Imaging Data:  Assessment and Plan:   Left otitis media with effusion  Pregnant and not yet delivered in third trimester  The patient's wisdom tooth may be contributing somewhat to the  pain also. We will treat for otitis media.  Follow-up: No Follow-up on file.  New Prescriptions   AMOXICILLIN (AMOXIL) 500 MG CAPSULE    Take 2 capsules (1,000 mg total) by mouth 2 (two) times daily.   No orders of the defined types were placed in this encounter.    Signed,  Elpidio GaleaSpencer T. Kazuko Clemence, MD   Patient's Medications  New Prescriptions   AMOXICILLIN (AMOXIL) 500 MG CAPSULE    Take 2 capsules (1,000 mg total) by mouth 2 (two) times daily.  Previous Medications   ALBUTEROL (PROVENTIL HFA) 108 (90 BASE) MCG/ACT INHALER    Inhale 2 puffs into the lungs every 4 (four) hours as needed for wheezing.   ARIPIPRAZOLE (ABILIFY) 5 MG TABLET    Take 5 mg by mouth daily.    CLONIDINE (CATAPRES) 0.2 MG TABLET    Take 0.2 mg by mouth. 1 in am and 1/2 in pm  Modified Medications   No medications on file  Discontinued Medications   TETRABENAZINE (XENAZINE) 12.5 MG TABLET    Take 12.5 mg by mouth 2 (two) times daily.

## 2014-10-15 ENCOUNTER — Encounter: Payer: Self-pay | Admitting: Maternal & Fetal Medicine

## 2014-10-29 ENCOUNTER — Inpatient Hospital Stay: Payer: Self-pay | Admitting: Internal Medicine

## 2014-10-30 LAB — CBC WITH DIFFERENTIAL/PLATELET
BASOS ABS: 0 10*3/uL (ref 0.0–0.1)
Basophil %: 0.1 %
EOS PCT: 1.3 %
Eosinophil #: 0.2 10*3/uL (ref 0.0–0.7)
HCT: 35.1 % (ref 35.0–47.0)
HGB: 11.9 g/dL — ABNORMAL LOW (ref 12.0–16.0)
LYMPHS ABS: 2.5 10*3/uL (ref 1.0–3.6)
LYMPHS PCT: 18.9 %
MCH: 29.9 pg (ref 26.0–34.0)
MCHC: 33.9 g/dL (ref 32.0–36.0)
MCV: 88 fL (ref 80–100)
Monocyte #: 1 x10 3/mm — ABNORMAL HIGH (ref 0.2–0.9)
Monocyte %: 7.6 %
NEUTROS ABS: 9.6 10*3/uL — AB (ref 1.4–6.5)
Neutrophil %: 72.1 %
PLATELETS: 227 10*3/uL (ref 150–440)
RBC: 3.99 10*6/uL (ref 3.80–5.20)
RDW: 13.6 % (ref 11.5–14.5)
WBC: 13.3 10*3/uL — ABNORMAL HIGH (ref 3.6–11.0)

## 2014-11-01 LAB — HEMATOCRIT: HCT: 21.9 % — ABNORMAL LOW (ref 35.0–47.0)

## 2014-11-02 LAB — HEMATOCRIT: HCT: 21.4 % — AB (ref 35.0–47.0)

## 2014-12-18 LAB — HM PAP SMEAR: HM PAP: NORMAL

## 2015-02-19 ENCOUNTER — Encounter: Payer: Self-pay | Admitting: Family Medicine

## 2015-02-19 ENCOUNTER — Ambulatory Visit (INDEPENDENT_AMBULATORY_CARE_PROVIDER_SITE_OTHER): Payer: Self-pay | Admitting: Family Medicine

## 2015-02-19 VITALS — BP 106/78 | HR 89 | Temp 97.8°F | Ht 62.0 in | Wt 194.1 lb

## 2015-02-19 DIAGNOSIS — N912 Amenorrhea, unspecified: Secondary | ICD-10-CM

## 2015-02-19 DIAGNOSIS — J019 Acute sinusitis, unspecified: Secondary | ICD-10-CM

## 2015-02-19 LAB — POCT URINE PREGNANCY: PREG TEST UR: NEGATIVE

## 2015-02-19 NOTE — Patient Instructions (Addendum)
You have a sinus infection but likely viral. This should improve over next week.  Push fluids and plenty of rest. May use claritin D for decongestant. Take ibuprofen 400mg  twice daily with food for 3 days.  Nasal saline irrigation or neti pot to help drain sinuses. May continue plain mucinex with plenty of fluid to help mobilize mucous. Please let us know if fever >101.5, persistent symptoms past 7-10 days, or worsening productive instead of improving as expected.

## 2015-02-19 NOTE — Assessment & Plan Note (Signed)
Anticipate viral given short duration.  Supportive care as per instructions. Update if not improving over next week. Red flags to seek care discussed. Upreg today.

## 2015-02-19 NOTE — Progress Notes (Signed)
BP 106/78 mmHg  Pulse 89  Temp(Src) 97.8 F (36.6 C) (Oral)  Ht 5\' 2"  (1.575 m)  Wt 194 lb 1.9 oz (88.052 kg)  BMI 35.50 kg/m2  SpO2 97%  LMP 01/10/2015  Breastfeeding? Unknown   CC: URI  Subjective:    Patient ID: Caroline Fletcher, female    DOB: February 25, 1986, 29 y.o.   MRN: [id number]  HPI: Caroline Fletcher is a 29 y.o. female presenting on 02/19/2015 for URI   1d h/o low grade fever to 99 yesterday, congestion, ear ache. + toothache as well. + PNDrainage and mild ST. + sinus pressure headache.   No cough.  So far has tried mucinex for this, as well as ibuprofen.   Mom sick recently as well. No h/o allergic rhinitis.  + asthma - doesn't really need to use inhaler. No smokers at home.  3 mo old at home. Not breast feeding. She is late however to her period today and requests Upreg today.  Relevant past medical, surgical, family and social history reviewed and updated as indicated. Interim medical history since our last visit reviewed. Allergies and medications reviewed and updated. Current Outpatient Prescriptions on File Prior to Visit  Medication Sig  . ARIPiprazole (ABILIFY) 5 MG tablet Take 5 mg by mouth daily.   . cloNIDine (CATAPRES) 0.2 MG tablet Take 0.2 mg by mouth. 1 in am and 1/2 in pm   No current facility-administered medications on file prior to visit.    Review of Systems Per HPI unless specifically indicated above     Objective:    BP 106/78 mmHg  Pulse 89  Temp(Src) 97.8 F (36.6 C) (Oral)  Ht 5\' 2"  (1.575 m)  Wt 194 lb 1.9 oz (88.052 kg)  BMI 35.50 kg/m2  SpO2 97%  LMP 01/10/2015  Breastfeeding? Unknown  Wt Readings from Last 3 Encounters:  02/19/15 194 lb 1.9 oz (88.052 kg)  10/03/14 203 lb 4 oz (92.194 kg)  12/11/13 161 lb (73.029 kg)    Physical Exam  Constitutional: She appears well-developed and well-nourished. No distress.  HENT:  Head: Normocephalic and atraumatic.  Right Ear: Hearing, tympanic membrane, external  ear and ear canal normal.  Left Ear: Hearing, tympanic membrane, external ear and ear canal normal.  Nose: Mucosal edema and rhinorrhea present. Right sinus exhibits maxillary sinus tenderness. Right sinus exhibits no frontal sinus tenderness. Left sinus exhibits maxillary sinus tenderness. Left sinus exhibits no frontal sinus tenderness.  Mouth/Throat: Uvula is midline and mucous membranes are normal. Posterior oropharyngeal erythema present. No oropharyngeal exudate, posterior oropharyngeal edema or tonsillar abscesses.  Nasal congestion present  Eyes: Conjunctivae and EOM are normal. Pupils are equal, round, and reactive to light. No scleral icterus.  Neck: Normal range of motion. Neck supple.  Cardiovascular: Normal rate, regular rhythm, normal heart sounds and intact distal pulses.   No murmur heard. Pulmonary/Chest: Effort normal and breath sounds normal. No respiratory distress. She has no wheezes. She has no rales.  Lymphadenopathy:    She has no cervical adenopathy.  Skin: Skin is warm and dry. No rash noted.  Nursing note and vitals reviewed.     Assessment & Plan:   Problem List Items Addressed This Visit    Acute infection of nasal sinus - Primary    Anticipate viral given short duration.  Supportive care as per instructions. Update if not improving over next week. Red flags to seek care discussed. Upreg today.          Follow  up plan: Return if symptoms worsen or fail to improve.

## 2015-02-19 NOTE — Progress Notes (Signed)
Pre visit review using our clinic review tool, if applicable. No additional management support is needed unless otherwise documented below in the visit note. 

## 2015-03-16 NOTE — Consult Note (Signed)
Referral Information:  Reason for Referral 29 yo gravida 1 at 59w0dgestation by [facility] due to medication exposure prescribed for Tourette's syndrome.   Prenatal Hx The patient's Tourette's is manifest by choking with meals which on several occasions has devolved into shortness of breath.  On 3 occasions she had to be treated in the ED.  She has never required admission to the hospital or intubation.  Her symptoms began when she was 29years old.  She was diagnosed at age 29  She [facility]  During that time she has been tried on at least 10 different medications.  Prior to pregnancy she was on a low-dose of clonidine, Klonopin and Abilify.  Abilify is the first drug that she has ever taken that has completely controlled her symptoms.  She has not had an episode since January when she started on the medication. After meeting with [facility], she stopped the Klonopin, but is continuing on the clonodine and the Abilify.  Her neurologist knew of her plans to become pregnant, but she has not notified him yet.   Home Medications: Medication Instructions Status  ARIPiprazole 5 mg oral tablet 1 tab(s) orally once a day Active  multivitamin, prenatal 1   once a day Active  folic acid 1 mg oral tablet 1 tab(s) orally once a day Active  clonidine 0.2 mg oral tablet 1 tab(s) orally once a day Active   Allergies:   Sudafed: Unknown  Vital Signs/Notes:  Nursing Vital Signs: **Vital Signs.:   04-May-15 08:56  Vital Signs Type Routine  Temperature Temperature (F) 98.2  Celsius 36.7  Pulse Pulse 90  Respirations Respirations 20  Systolic BP Systolic BP 1751 Diastolic BP (mmHg) Diastolic BP (mmHg) 62  Pulse Ox % Pulse Ox % 98  Oxygen Delivery Room Air/ 21 %   Perinatal Consult:  PGyn Hx No significant GYN history   Past Medical History cont'd She has no other significant  medical history other than the Tourette's.   PSurg Hx None   FHx See genetic counselor's note.   Occupation Mother Works at [employer]  Occupation Father NA at [employer] married   Review Of Systems:  Fever/Chills No   Cough No   Abdominal Pain No   Diarrhea No   Constipation No   Nausea/Vomiting Nausea, no vomiting   SOB/DOE No   Chest Pain No   Dysuria No   Tolerating Diet Yes   Medications/Allergies Reviewed Medications/Allergies reviewed   Exam:  Today's Weight 165 lbs    Additional Lab/Radiology Notes Height 5 ft 2 inches BMI 30   Impression/Recommendations:  Impression 29yo gravida 1 at 858w0destation with Tourette's syndrome manifest by choking now well controlled on current regimen of low dose of clonodine and low dose of Abilify   Recommendations The patient met with our genetic counselor, DeDonette Larryand was counseled about the low association of fetal problems with either of her medications.  Data are limited, however, due to the infrequent use of clonodine and the recent introduction of Abilify.  Clonodine, which when it is used in pregnancy is used to treat hypertension, is associated with fetal growth restriction. The benefits of her medications outweigh the risks.   I doubt at her dose or for her indication, the clonodine will be a problem.  Nonetheless, I would recommend the fetal surveillance  as outlined below.  The growth ultrasounds [facility] performed at [facility].  We have scheduled her to return here at 18 weeks for a detailed anatomy scan.  She has an appointment with Dr. Tonye Royalty, her neurologist in September, which will be the start of her third trimester.  She can discuss with him her plans to deliver at [facility], as opposed to [place], where I imagine he affiliates.  I imagine he will concur with her plans.  In the third trimester, anesthesia should be consulted or at least notified of her history.   Plan:  Genetic Counseling yes, A full  genetic counseling note will follow   Ultrasound at what gestational ages Monthly > 28 weeks   Antepartum Testing Weekly, Starting at 33 to 36 weeks   Delivery at what gestational age [redacted] weeks    Comments Thank you for allowing [place] to participate in her care.    Total Time Spent with Patient 45 minutes   >50% of visit spent in couseling/coordination of care yes   Office Use Only 99[address]  Level 3 (65mn) NEW office consult detailed   Coding Description: MATERNAL CONDITIONS/HISTORY INDICATION(S).   OTHER: Tourette's syndrome on Abilify and clonodine.  Electronic Signatures: JDellia Nims(MD)  (Signed [phone])  Authored: Referral, Home Medications, Allergies, Vital Signs/Notes, Consult, Exam, Lab/Radiology Notes, Impression, Plan, Other Comments, Billing, Coding Description   Last Updated: 04-May-15 13:32 by JDellia Nims(MD)

## 2015-04-02 NOTE — H&P (Signed)
L&D Evaluation:  History:  HPI Pt is a 29 yo G1 at 5663w0d GA with an EDC of 11/05/14 who presents to L&D with reports of contractions. She also reports +FM, denies vb. She reports the contractions started earlier today. Her prenatal course has been complicated by exposure to clonidine and abilify for her tourette's syndrome. She has been followed by [facility] where she has had u/s for growth. Her last scan was 10/26 at the [facility] was 2400g which was 81% The scan also showed right renal pelviectasis. She also has a history of chlamydia. She is O+, VI, RI.   Presents with contractions   Patient's Medical History tourettes, chlamydia, ASC-US pap   Patient's Surgical History cleft palate repair, colpo   Medications Pre Natal Vitamins  clonidine, abilify, folic acid,   Allergies NKDA   Social History none   Family History Non-Contributory   ROS:  ROS All systems were reviewed.  HEENT, CNS, GI, GU, Respiratory, CV, Renal and Musculoskeletal systems were found to be normal.   Exam:  Vital Signs stable   General no apparent distress   Mental Status clear   Abdomen gravid, non-tender   Pelvic cervix closed and thick   Mebranes Intact   FHT 130's, moderate variability, +accels, no decels   Ucx absent   Skin dry, no lesions, no rashes   Lymph no lymphadenopathy   Impression:  Impression reactive NST, IUP at 2763w0d, Cat 1 FHT   Plan:  Plan discharge, FKC and PTL precautions discussed   Follow Up Appointment already scheduled. Friday 10/05/14   Electronic Signatures: Jannet MantisSubudhi, Jaser Fullen (CNM)  (Signed [phone]-Nov-15 21:25)  Authored: L&D Evaluation   Last Updated: 09-Nov-15 21:25 by Jannet MantisSubudhi, Gabbriella Presswood (CNM)

## 2015-07-10 ENCOUNTER — Ambulatory Visit (INDEPENDENT_AMBULATORY_CARE_PROVIDER_SITE_OTHER): Payer: 59 | Admitting: Family Medicine

## 2015-07-10 ENCOUNTER — Encounter: Payer: Self-pay | Admitting: Family Medicine

## 2015-07-10 VITALS — BP 104/72 | HR 88 | Temp 98.3°F | Ht 62.75 in | Wt 207.0 lb

## 2015-07-10 DIAGNOSIS — Z111 Encounter for screening for respiratory tuberculosis: Secondary | ICD-10-CM

## 2015-07-10 DIAGNOSIS — Z0000 Encounter for general adult medical examination without abnormal findings: Secondary | ICD-10-CM

## 2015-07-10 MED ORDER — ALBUTEROL SULFATE HFA 108 (90 BASE) MCG/ACT IN AERS: 2.0000 | INHALATION_SPRAY | Freq: Four times a day (QID) | RESPIRATORY_TRACT | Status: DC | PRN

## 2015-07-10 MED ORDER — MOMETASONE FUROATE 220 MCG/INH IN AEPB: 1.0000 | INHALATION_SPRAY | Freq: Every day | RESPIRATORY_TRACT | Status: DC

## 2015-07-10 NOTE — Progress Notes (Signed)
Dr. Karleen Hampshire T. Anaysha Andre, MD, CAQ Sports Medicine Primary Care and Sports Medicine [address] [place], [address] Phone: [phone] Fax: [phone]  07/10/2015  Patient: Caroline Fletcher, MRN: [id number], DOB: September 23, 1986, 29 y.o.  Primary Physician:  Hannah Beat, MD  Chief Complaint: Annual Exam  Subjective:   Genora Arp is a 29 y.o. pleasant patient who presents with the following:  Health Maintenance Summary Reviewed and updated, unless pt declines services.  Tobacco History Reviewed. Non-smoker Alcohol: No concerns, no excessive use Exercise Habits: Some activity, rec at least 30 mins 5 times a week STD concerns: none Drug Use: None Menses regular: yes Lumps or breast concerns: no Breast Cancer Family History: no  Tdap -  07/2014.  Hep B. All 3 series.   PPD today  Health Maintenance  Topic Date Due  . HIV Screening  03/17/2003  . TETANUS/TDAP  03/17/2007  . INFLUENZA VACCINE  06/24/2015  . PAP SMEAR  07/09/2018    Immunization History  Administered Date(s) Administered  . PPD Test 07/10/2015   Patient Active Problem List   Diagnosis Date Noted  . Asthma, chronic 06/15/2013  . TOURETTE'S DISORDER ([facility]) 03/07/2008   Past Medical History  Diagnosis Date  . Tourette's disorder     Dr. Sandria Manly  . Asthma, chronic 06/15/2013   Past Surgical History  Procedure Laterality Date  . Myringotomy      bilateral  . Pallet repair  1990   Social History   Social History  . Marital Status: Married    Spouse Name: N/A  . Number of Children: N/A  . Years of Education: N/A   Occupational History  . Not on file.   Social History Main Topics  . Smoking status: Never Smoker   . Smokeless tobacco: Never Used  . Alcohol Use: Yes     Comment: Occasional  . Drug Use: No  . Sexual Activity: Not on file   Other Topics Concern  . Not on file   Social History Narrative   Family History  Problem Relation Age of Onset  .  Healthy Father   . Healthy Mother   . Diabetes Maternal Grandfather   . Stroke Paternal Grandmother   . Heart attack Paternal Grandmother   . Cancer Neg Hx    No Known Allergies  Medication list has been reviewed and updated.   General: Denies fever, chills, sweats. No significant weight loss. Eyes: Denies blurring,significant itching ENT: Denies earache, sore throat, and hoarseness.  Cardiovascular: Denies chest pains, palpitations, dyspnea on exertion,  Respiratory: Denies cough, dyspnea at rest,wheeezing Breast: no concerns about lumps GI: Denies nausea, vomiting, diarrhea, constipation, change in bowel habits, abdominal pain, melena, hematochezia GU: Denies dysuria, hematuria, urinary hesitancy, nocturia, denies STD risk, no concerns about discharge Musculoskeletal: Denies back pain, joint pain Derm: Denies rash, itching Neuro: Denies  paresthesias, frequent falls, frequent headaches Psych: Denies depression, anxiety Endocrine: Denies cold intolerance, heat intolerance, polydipsia Heme: Denies enlarged lymph nodes Allergy: No hayfever  Objective:   BP 104/72 mmHg  Pulse 88  Temp(Src) 98.3 F (36.8 C) (Oral)  Ht 5' 2.75" (1.594 m)  Wt 207 lb (93.895 kg)  BMI 36.95 kg/m2  LMP 06/22/2015  Breastfeeding? No No exam data present  GEN: well developed, well nourished, no acute distress Eyes: conjunctiva and lids normal, PERRLA, EOMI ENT: TM clear, nares clear, oral exam WNL Neck: supple, no lymphadenopathy, no thyromegaly, no JVD Pulm: clear to auscultation and percussion, respiratory effort normal  CV: regular rate and rhythm, S1-S2, no murmur, rub or gallop, no bruits Chest: no scars, masses, no lumps BREAST: breast exam declined GI: soft, non-tender; no hepatosplenomegaly, masses; active bowel sounds all quadrants GU: GU exam declined Lymph: no cervical, axillary or inguinal adenopathy MSK: gait normal, muscle tone and strength WNL, no joint swelling, effusions,  discoloration, crepitus  SKIN: clear, good turgor, color WNL, no rashes, lesions, or ulcerations Neuro: normal mental status, normal strength, sensation, and motion Psych: alert; oriented to person, place and time, normally interactive and not anxious or depressed in appearance.   All labs reviewed with patient. Recent labs reviewed.  Assessment and Plan:   Healthcare maintenance  Screening for tuberculosis - Plan: TB Skin Test  Health Maintenance Exam: The patient's preventative maintenance and recommended screening tests for an annual wellness exam were reviewed in full today. Brought up to date unless services declined.  Counselled on the importance of diet, exercise, and its role in overall health and mortality. The patient's FH and SH was reviewed, including their home life, tobacco status, and drug and alcohol status.  Follow-up: No Follow-up on file. Or follow-up in 1 year for complete physical examination  New Prescriptions   No medications on file   Orders Placed This Encounter  Procedures  . TB Skin Test    Signed,  Karleen Hampshire T. Novis League, MD   Patient's Medications  New Prescriptions   No medications on file  Previous Medications   ARIPIPRAZOLE (ABILIFY) 2 MG TABLET    Take 2 mg by mouth daily.   CLONIDINE (CATAPRES) 0.1 MG TABLET    Take 0.1 mg by mouth daily.  Modified Medications   Modified Medication Previous Medication   ALBUTEROL (PROVENTIL HFA;VENTOLIN HFA) 108 (90 BASE) MCG/ACT INHALER albuterol (PROVENTIL HFA;VENTOLIN HFA) 108 (90 BASE) MCG/ACT inhaler      Inhale 2 puffs into the lungs every 6 (six) hours as needed for wheezing.    Inhale 2 puffs into the lungs every 6 (six) hours as needed for wheezing.   MOMETASONE (ASMANEX 60 METERED DOSES) 220 MCG/INH INHALER mometasone (ASMANEX 60 METERED DOSES) 220 MCG/INH inhaler      Inhale 1 puff into the lungs daily.    Inhale 1 puff into the lungs daily.  Discontinued Medications   ARIPIPRAZOLE (ABILIFY) 5  MG TABLET    Take 5 mg by mouth daily.    CLONIDINE (CATAPRES) 0.2 MG TABLET    Take 0.2 mg by mouth. 1 in am and 1/2 in pm

## 2015-07-10 NOTE — Progress Notes (Signed)
Pre visit review using our clinic review tool, if applicable. No additional management support is needed unless otherwise documented below in the visit note. 

## 2015-07-12 LAB — TB SKIN TEST: TB SKIN TEST: NEGATIVE

## 2015-10-17 ENCOUNTER — Emergency Department
Admission: EM | Admit: 2015-10-17 | Discharge: 2015-10-17 | Disposition: A | Discharge status: Home / Self Care | Payer: 59 | Attending: Emergency Medicine | Admitting: Emergency Medicine

## 2015-10-17 ENCOUNTER — Encounter: Payer: Self-pay | Admitting: Emergency Medicine

## 2015-10-17 DIAGNOSIS — R109 Unspecified abdominal pain: Secondary | ICD-10-CM | POA: Insufficient documentation

## 2015-10-17 DIAGNOSIS — I4581 Long QT syndrome: Secondary | ICD-10-CM | POA: Insufficient documentation

## 2015-10-17 DIAGNOSIS — Z3202 Encounter for pregnancy test, result negative: Secondary | ICD-10-CM | POA: Diagnosis not present

## 2015-10-17 DIAGNOSIS — R34 Anuria and oliguria: Secondary | ICD-10-CM | POA: Insufficient documentation

## 2015-10-17 DIAGNOSIS — M545 Low back pain: Secondary | ICD-10-CM | POA: Diagnosis not present

## 2015-10-17 DIAGNOSIS — R197 Diarrhea, unspecified: Secondary | ICD-10-CM | POA: Diagnosis not present

## 2015-10-17 DIAGNOSIS — Z79899 Other long term (current) drug therapy: Secondary | ICD-10-CM | POA: Insufficient documentation

## 2015-10-17 DIAGNOSIS — R509 Fever, unspecified: Secondary | ICD-10-CM | POA: Insufficient documentation

## 2015-10-17 DIAGNOSIS — Z7951 Long term (current) use of inhaled steroids: Secondary | ICD-10-CM | POA: Insufficient documentation

## 2015-10-17 DIAGNOSIS — R9431 Abnormal electrocardiogram [ECG] [EKG]: Secondary | ICD-10-CM

## 2015-10-17 DIAGNOSIS — R112 Nausea with vomiting, unspecified: Secondary | ICD-10-CM | POA: Diagnosis present

## 2015-10-17 LAB — CBC
HEMATOCRIT: 38.6 % (ref 35.0–47.0)
Hemoglobin: 13.4 g/dL (ref 12.0–16.0)
MCH: 28.5 pg (ref 26.0–34.0)
MCHC: 34.8 g/dL (ref 32.0–36.0)
MCV: 81.8 fL (ref 80.0–100.0)
Platelets: 252 10*3/uL (ref 150–440)
RBC: 4.71 MIL/uL (ref 3.80–5.20)
RDW: 12.5 % (ref 11.5–14.5)
WBC: 9.7 10*3/uL (ref 3.6–11.0)

## 2015-10-17 LAB — COMPREHENSIVE METABOLIC PANEL
ALBUMIN: 3.8 g/dL (ref 3.5–5.0)
ALK PHOS: 58 U/L (ref 38–126)
ALT: 22 U/L (ref 14–54)
AST: 21 U/L (ref 15–41)
Anion gap: 8 (ref 5–15)
BILIRUBIN TOTAL: 0.8 mg/dL (ref 0.3–1.2)
BUN: 14 mg/dL (ref 6–20)
CO2: 26 mmol/L (ref 22–32)
CREATININE: 0.77 mg/dL (ref 0.44–1.00)
Calcium: 8.2 mg/dL — ABNORMAL LOW (ref 8.9–10.3)
Chloride: 104 mmol/L (ref 101–111)
GFR calc Af Amer: >60 mL/min (ref >60)
GFR calc non Af Amer: >60 mL/min (ref >60)
GLUCOSE: 109 mg/dL — AB (ref 65–99)
POTASSIUM: 3.8 mmol/L (ref 3.5–5.1)
Sodium: 138 mmol/L (ref 135–145)
TOTAL PROTEIN: 7.2 g/dL (ref 6.5–8.1)

## 2015-10-17 LAB — URINALYSIS COMPLETE WITH MICROSCOPIC (ARMC ONLY)
BACTERIA UA: NONE SEEN
BILIRUBIN URINE: NEGATIVE
Glucose, UA: NEGATIVE mg/dL
KETONES UR: NEGATIVE mg/dL
LEUKOCYTES UA: NEGATIVE
NITRITE: NEGATIVE
PH: 6 (ref 5.0–8.0)
PROTEIN: NEGATIVE mg/dL
Specific Gravity, Urine: 1.008 (ref 1.005–1.030)

## 2015-10-17 LAB — LIPASE, BLOOD: Lipase: 20 U/L (ref 11–51)

## 2015-10-17 LAB — POCT PREGNANCY, URINE: Preg Test, Ur: NEGATIVE

## 2015-10-17 MED ORDER — LORAZEPAM 2 MG/ML IJ SOLN
0.5000 mg | Freq: Once | INTRAMUSCULAR | Status: AC
  Administered 2015-10-17: 0.5 mg via INTRAVENOUS
  Filled 2015-10-17: qty 1

## 2015-10-17 MED ORDER — ACETAMINOPHEN 500 MG PO TABS
1000.0000 mg | ORAL_TABLET | ORAL | Status: AC
  Administered 2015-10-17: 1000 mg via ORAL
  Filled 2015-10-17: qty 2

## 2015-10-17 MED ORDER — LORAZEPAM 0.5 MG PO TABS: 0.5000 mg | ORAL_TABLET | Freq: Three times a day (TID) | ORAL | Status: DC | PRN

## 2015-10-17 MED ORDER — LORAZEPAM 0.5 MG PO TABS: 0.5000 mg | ORAL_TABLET | Freq: Three times a day (TID) | ORAL | Status: DC

## 2015-10-17 MED ORDER — SODIUM CHLORIDE 0.9 % IV BOLUS (SEPSIS)
1000.0000 mL | Freq: Once | INTRAVENOUS | Status: AC
  Administered 2015-10-17: 1000 mL via INTRAVENOUS

## 2015-10-17 NOTE — Discharge Instructions (Signed)
Please follow up closely with your primary care doctor. Your EKG was slightly abnormal, possibly secondary to your medications and I recommend he have a repeat EKG in follow up for a "prolonged QT interval" with your primary care doctor as soon as possible.  You have been seen in the Emergency Department (ED) today for nausea and vomiting.    Follow up with your doctor as soon as possible regarding todays emergent visit and your symptoms of nausea. I do not drive or work with dangerous equipment while taking Ativan.  Return to the ED if you develop abdominal, bloody vomiting, bloody diarrhea, if you are unable to tolerate fluids due to vomiting, or if you develop other symptoms that concern you.   Nausea and Vomiting Nausea is a sick feeling that often comes before throwing up (vomiting). Vomiting is a reflex where stomach contents come out of your mouth. Vomiting can cause severe loss of body fluids (dehydration). Children and elderly adults can become dehydrated quickly, especially if they also have diarrhea. Nausea and vomiting are symptoms of a condition or disease. It is important to find the cause of your symptoms. CAUSES   Direct irritation of the stomach lining. This irritation can result from increased acid production (gastroesophageal reflux disease), infection, food poisoning, taking certain medicines (such as nonsteroidal anti-inflammatory drugs), alcohol use, or tobacco use.  Signals from the brain.These signals could be caused by a headache, heat exposure, an inner ear disturbance, increased pressure in the brain from injury, infection, a tumor, or a concussion, pain, emotional stimulus, or metabolic problems.  An obstruction in the gastrointestinal tract (bowel obstruction).  Illnesses such as diabetes, hepatitis, gallbladder problems, appendicitis, kidney problems, cancer, sepsis, atypical symptoms of a heart attack, or eating disorders.  Medical treatments such as chemotherapy  and radiation.  Receiving medicine that makes you sleep (general anesthetic) during surgery. DIAGNOSIS Your caregiver may ask for tests to be done if the problems do not improve after a few days. Tests may also be done if symptoms are severe or if the reason for the nausea and vomiting is not clear. Tests may include:  Urine tests.  Blood tests.  Stool tests.  Cultures (to look for evidence of infection).  X-rays or other imaging studies. Test results can help your caregiver make decisions about treatment or the need for additional tests. TREATMENT You need to stay well hydrated. Drink frequently but in small amounts.You may wish to drink water, sports drinks, clear broth, or eat frozen ice pops or gelatin dessert to help stay hydrated.When you eat, eating slowly may help prevent nausea.There are also some antinausea medicines that may help prevent nausea. HOME CARE INSTRUCTIONS   Take all medicine as directed by your caregiver.  If you do not have an appetite, do not force yourself to eat. However, you must continue to drink fluids.  If you have an appetite, eat a normal diet unless your caregiver tells you differently.  Eat a variety of complex carbohydrates (rice, wheat, potatoes, bread), lean meats, yogurt, fruits, and vegetables.  Avoid high-fat foods because they are more difficult to digest.  Drink enough water and fluids to keep your urine clear or pale yellow.  If you are dehydrated, ask your caregiver for specific rehydration instructions. Signs of dehydration may include:  Severe thirst.  Dry lips and mouth.  Dizziness.  Dark urine.  Decreasing urine frequency and amount.  Confusion.  Rapid breathing or pulse. SEEK IMMEDIATE MEDICAL CARE IF:   You have blood  or brown flecks (like coffee grounds) in your vomit.  You have black or bloody stools.  You have a severe headache or stiff neck.  You are confused.  You have severe abdominal pain.  You  have chest pain or trouble breathing.  You do not urinate at least once every 8 hours.  You develop cold or clammy skin.  You continue to vomit for longer than 24 to 48 hours.  You have a fever. MAKE SURE YOU:   Understand these instructions.  Will watch your condition.  Will get help right away if you are not doing well or get worse.   This information is not intended to replace advice given to you by your health care provider. Make sure you discuss any questions you have with your health care provider.   Document Released: 11/09/2005 Document Revised: 02/01/2012 Document Reviewed: 04/08/2011 Elsevier Interactive Patient Education [employer]2016 Elsevier Inc.

## 2015-10-17 NOTE — ED Notes (Signed)
Pt states N/V/D since approx 0100 this morning. Pt states her son was sick prior in the week with the same symptoms. Pt states fever earlier today but took Tylenol and thinks that her fever has broken. Pt states she has been unable to keep anything down today.

## 2015-10-17 NOTE — ED Provider Notes (Signed)
[facility] Emergency Department Provider Note  REMINDER - THIS NOTE IS NOT A FINAL MEDICAL RECORD UNTIL IT IS SIGNED. UNTIL THEN, THE CONTENT BELOW MAY REFLECT INFORMATION FROM A DOCUMENTATION TEMPLATE, NOT THE ACTUAL PATIENT VISIT. ____________________________________________  Time seen: Approximately 8:03 PM  I have reviewed the triage vital signs and the nursing notes.   HISTORY  Chief Complaint Abdominal Pain; Emesis; and Nausea    HPI Caroline Fletcher is a 29 y.o. female history of Tourette's who presents for sudden onset of nausea and vomiting and diarrhea starting about 1:00 today.  Patient reports that her son is also sick with "stomach flu" and then this morning she developed sudden onset of nausea and feeling sick to her stomach. Then threw up about 5 times during the course of day, notable to keep much fluid down, with associated loose watery diarrhea.  She has not had any recent antibiotics, no recent hospitalizations, no travel, no bloody diarrhea. She reports intermittent crampy abdominal discomfort but did have a fever at home today. No chest pain or trouble breathing. No throat discomfort, no trouble swallowing.  She does report being on Abilify.  Patient reports she feels as though she has a "stomach flu". No cough or trouble breathing. No runny nose.  Past Medical History  Diagnosis Date  . Tourette's disorder     Dr. Sandria Manly  . Asthma, chronic 06/15/2013    Patient Active Problem List   Diagnosis Date Noted  . Asthma, chronic 06/15/2013  . TOURETTE'S DISORDER ([facility]) 03/07/2008    Past Surgical History  Procedure Laterality Date  . Myringotomy      bilateral  . Pallet repair  1990    Current Outpatient Rx  Name  Route  Sig  Dispense  Refill  . albuterol (PROVENTIL HFA;VENTOLIN HFA) 108 (90 BASE) MCG/ACT inhaler   Inhalation   Inhale 2 puffs into the lungs every 6 (six) hours as needed for wheezing.   1 Inhaler   3   .  ARIPiprazole (ABILIFY) 2 MG tablet   Oral   Take 2 mg by mouth daily.         . cloNIDine (CATAPRES) 0.1 MG tablet   Oral   Take 0.1 mg by mouth daily.         Marland Kitchen LORazepam (ATIVAN) 0.5 MG tablet   Oral   Take 1 tablet (0.5 mg total) by mouth every 8 (eight) hours as needed (nausea).   20 tablet   0   . mometasone (ASMANEX 60 METERED DOSES) 220 MCG/INH inhaler   Inhalation   Inhale 1 puff into the lungs daily.   1 Inhaler   3     Allergies Review of patient's allergies indicates no known allergies.  Family History  Problem Relation Age of Onset  . Healthy Father   . Healthy Mother   . Diabetes Maternal Grandfather   . Stroke Paternal Grandmother   . Heart attack Paternal Grandmother   . Cancer Neg Hx     Social History Social History  Substance Use Topics  . Smoking status: Never Smoker   . Smokeless tobacco: Never Used  . Alcohol Use: No     Comment: Occasional    Review of Systems Constitutional: No chills, but is having fever reporting a fever up to 102 at home Eyes: No visual changes. ENT: No sore throat. Feels dry. Cardiovascular: Denies chest pain. Respiratory: Denies shortness of breath. Gastrointestinal: No constipation. Genitourinary: Negative for dysuria. No foul odor.  Has decreased urination over the last few hours. Musculoskeletal: She had achy discomfort in her lower back earlier today, this is resolved. Skin: Negative for rash. Neurological: Negative for headaches, focal weakness or numbness.  No vaginal discharge except for some mild spotting consistent with "period". No pelvic pain.  10-point ROS otherwise negative.  ____________________________________________   PHYSICAL EXAM:  VITAL SIGNS: ED Triage Vitals  Enc Vitals Group     BP 10/17/15 1824 145/82 mmHg     Pulse Rate 10/17/15 1824 113     Resp 10/17/15 1824 18     Temp 10/17/15 1824 98.6 F (37 C)     Temp Source 10/17/15 1824 Oral     SpO2 10/17/15 1824 96 %      Weight 10/17/15 1824 198 lb (89.812 kg)     Height 10/17/15 1824  (1.575 m)     Head Cir --      Peak Flow --      Pain Score 10/17/15 1829 4     Pain Loc --      Pain Edu? --      Excl. in GC? --    Constitutional: Alert and oriented. Well appearing and in no acute distress. Very amicable. Wasn't. Eyes: Conjunctivae are normal. PERRL. EOMI. Head: Atraumatic. Nose: No congestion/rhinnorhea. Mouth/Throat: Mucous membranes are slightly dry.  Oropharynx non-erythematous. Neck: No stridor.   Cardiovascular: Normal rate, regular rhythm. Grossly normal heart sounds.  Good peripheral circulation. Heart rate now approximately 90. Respiratory: Normal respiratory effort.  No retractions. Lungs CTAB. Gastrointestinal: Soft with mild nonfocal tenderness, no rebound or guarding. No pain to deep palpation in the right lower quadrant. Negative bedside Murphy. No distention. No abdominal bruits. No CVA tenderness. Musculoskeletal: No lower extremity tenderness nor edema.  No joint effusions. Neurologic:  Normal speech and language. No gross focal neurologic deficits are appreciated. Skin:  Skin is warm, dry and intact. No rash noted. Psychiatric: Mood and affect are normal. Speech and behavior are normal.  ____________________________________________   LABS (all labs ordered are listed, but only abnormal results are displayed)  Labs Reviewed  COMPREHENSIVE METABOLIC PANEL - Abnormal; Notable for the following:    Glucose, Bld 109 (*)    Calcium 8.2 (*)    All other components within normal limits  URINALYSIS COMPLETEWITH MICROSCOPIC ([facility] ONLY) - Abnormal; Notable for the following:    Color, Urine YELLOW (*)    APPearance CLEAR (*)    Hgb urine dipstick 2+ (*)    Squamous Epithelial / LPF 0-5 (*)    All other components within normal limits  LIPASE, BLOOD  CBC  POC URINE PREG, ED  POCT PREGNANCY, URINE   ____________________________________________  EKG  Reviewed and interpreted  by me for evaluation of QT  Medications EKG time 1959 Heart rate 95, normal sinus rhythm, no acute ischemic changes, very nonspecific T-wavein V3, QTC approximately 485, slightly greater than normal. PR 160 QRS 86 Interpreted as sinus rhythm with mild. Dopaminergic antiemetics and thus we'll give Ativan for an antiemetic.  ____________________________________________  RADIOLOGY  10PM: I discussed with the patient the risks and benefits of abdominal CT scan. The present time there is no clear indication that the patient requires CT, the patient does have an abdominal complaint but exam does not suggest acute surgical abdomen and my suspicion for intra-abdominal infection including appendicitis, cholecystitis, aaa, dissection, ischemia, perforation, pancreatitis, diverticulitis or other acute major intra-abdominal process is quite low. After discussing the risks and benefits including benefits  of additional evaluation for diagnoses, ruling out infection/perforation/aaa/etc, but also discussing the risks including low, "well less than 1%," but not 0 risk of inducing cancers due to radiation and potential risks of contrast the patient indicated via our shared medical decision-making that she would not do a CAT scan. Rather if the patient does have worsening symptoms, develops a high fever, develops pain or persistent discomfort in the right upper quadrant or right lower quadrant, or other new concerns arise they will come back to emergency room right away. As the patient's clinician I think this is a very reasonable decision having discussed general risks and benefits of CT, and my clinical suspicion that CT would be of benefit at this time is very low.  ____________________________________________   PROCEDURES  Procedure(s) performed: None  Critical Care performed: No  ____________________________________________   INITIAL IMPRESSION / ASSESSMENT AND PLAN / ED COURSE  Pertinent labs &  imaging results that were available during my care of the patient were reviewed by me and considered in my medical decision making (see chart for details).  Patient has for evaluation of nausea vomiting and diarrhea. She has hemodynamically stable, but is pierced slightly dehydrated. Her abdominal exam is reassuring, and she is in no distress. She is not any save any risk factors for bacterial enteritis. Given some symptomatology that also started in her son at about the Same Time, I Suspect It Is Some Type of viral illness, and exam demonstrates no peritonitis or signs or symptoms of focal discomfort or tenderness. Hydrate her generously, monitor her closely, and provide antiemetics for which I will avoid QT prolonging medications.  ----------------------------------------- 9:47 PM on 10/17/2015 -----------------------------------------  After hydration, labs are reassuring, vital signs improved, and she is taking oral fluids well. She reports her pain and symptoms of resolved. On repeat exam the abdomen is soft nontender and nondistended. At this point we discuss careful return precautions and I will discharge her with prescription for low-dose Ativan for nausea. She'll follow up closely with her primary care physician. No evidence of bacterial infection, much improved after hydration. Appears most consistent with viral syndrome at this time, likely gastroenteritis. Return precautions advised. Patient's husband will be driving her home. ____________________________________________   FINAL CLINICAL IMPRESSION(S) / ED DIAGNOSES  Final diagnoses:  Prolonged Q-T interval on ECG  Non-intractable vomiting with nausea, vomiting of unspecified type      Sharyn CreamerMark Emilianna Barlowe, MD 10/17/15 2337

## 2015-11-24 NOTE — L&D Delivery Note (Signed)
Delivery Note At 11:00 AM a viable female sex was delivered via Vaginal, Spontaneous Delivery (Presentation: ROA  ).  APGAR: pending ; weight 7 lb 2.6 oz (3250 g).   Placenta status: spontaneous, intact.  Cord:  3VC without complications.  Cord pH: N/A  Anesthesia:  epidural Episiotomy: None Lacerations: 1st degree Suture Repair: none Est. Blood Loss (mL):  200mL  Mom to postpartum.  Baby to Couplet care / Skin to Skin.  Stephaine Breshears M 08/23/2016, 11:12 AM

## 2015-12-27 LAB — OB RESULTS CONSOLE GC/CHLAMYDIA
Chlamydia: NEGATIVE
GC PROBE AMP, GENITAL: NEGATIVE

## 2015-12-27 LAB — OB RESULTS CONSOLE VARICELLA ZOSTER ANTIBODY, IGG: Varicella: IMMUNE

## 2015-12-27 LAB — OB RESULTS CONSOLE HIV ANTIBODY (ROUTINE TESTING): HIV: NONREACTIVE

## 2015-12-27 LAB — OB RESULTS CONSOLE RPR: RPR: NONREACTIVE

## 2015-12-27 LAB — OB RESULTS CONSOLE HEPATITIS B SURFACE ANTIGEN: Hepatitis B Surface Ag: NEGATIVE

## 2015-12-27 LAB — OB RESULTS CONSOLE RUBELLA ANTIBODY, IGM: RUBELLA: IMMUNE

## 2016-02-06 ENCOUNTER — Ambulatory Visit (INDEPENDENT_AMBULATORY_CARE_PROVIDER_SITE_OTHER): Payer: BLUE CROSS/BLUE SHIELD | Admitting: Family Medicine

## 2016-02-06 ENCOUNTER — Encounter: Payer: Self-pay | Admitting: Family Medicine

## 2016-02-06 VITALS — BP 110/78 | HR 100 | Temp 97.7°F | Wt 207.0 lb

## 2016-02-06 DIAGNOSIS — H578 Other specified disorders of eye and adnexa: Secondary | ICD-10-CM

## 2016-02-06 DIAGNOSIS — H5789 Other specified disorders of eye and adnexa: Secondary | ICD-10-CM | POA: Insufficient documentation

## 2016-02-06 MED ORDER — ERYTHROMYCIN 5 MG/GM OP OINT: 1.0000 "application " | TOPICAL_OINTMENT | Freq: Every day | OPHTHALMIC | Status: DC

## 2016-02-06 MED ORDER — LORATADINE 10 MG PO TABS: 10.0000 mg | ORAL_TABLET | Freq: Every day | ORAL | Status: DC

## 2016-02-06 NOTE — Patient Instructions (Signed)
I think you have combination of allergic reaction and viral conjunctivitis (pink eye). Treat with starting claritin daily for 1 week. Cool compresses to eye.  If worsening discharge or matting of eye during the day, fill antibiotic eye drop provided today.  Let us know if not improving with treatment.  Viral Conjunctivitis Viral conjunctivitis is an inflammation of the clear membrane that covers the white part of your eye and the inner surface of your eyelid (conjunctiva). The inflammation is caused by a viral infection. The blood vessels in the conjunctiva become inflamed, causing the eye to become red or pink, and often itchy. Viral conjunctivitis can easily be passed from one person to another (contagious). CAUSES  Viral conjunctivitis is caused by a virus. A virus is a type of contagious germ. It can be spread by touching objects that have been contaminated with the virus, such as doorknobs or towels.  SYMPTOMS  Symptoms of viral conjunctivitis may include:   Eye redness.  Tearing or watery eyes.  Itchy eyes.  Burning feeling in the eyes.  Clear drainage from the eye.  Swollen eyelids.  A gritty feeling in the eye.  Light sensitivity. DIAGNOSIS  Viral conjunctivitis may be diagnosed with a medical history and physical exam. If you have discharge from your eye, the discharge may be tested to rule out other causes of conjunctivitis.  TREATMENT  Viral conjunctivitis does not respond to medicines that kill bacteria (antibiotics). Treatment for viral conjunctivitis is directed at stopping a bacterial infection from developing in addition to the viral infection. Treatment also aims to relieve your symptoms, such as itching. This may be done with antihistamine drops or other eye medicines. HOME CARE INSTRUCTIONS  Take medicines only as directed by your health care provider.  Avoid touching or rubbing your eyes.  Apply a warm, clean washcloth to your eye for 10-20 minutes, 3-4 times  per day.  If you wear contact lenses, do not wear them until the inflammation is gone and your health care provider says it is safe to wear them again. Ask your health care provider how to sterilize or replace your contact lenses before using them again. Wear glasses until you can resume wearing contacts.  Avoid wearing eye makeup until the inflammation is gone. Throw away any old eye cosmetics that may be contaminated.  Change or wash your pillowcase every day.  Do not share towels or washcloths. This may spread the infection.  Wash your hands often with soap and water. Use paper towels to dry your hands.  Gently wipe away any drainage from your eye with a warm, wet washcloth or a cotton ball.  Be very careful to avoid touching the edge of the eyelid with the eye drop bottle or ointment tube when applying medicines to the affected eye. This will stop you from spreading the infection to the other eye or to other people. SEEK MEDICAL CARE IF:   Your symptoms do not improve with treatment.  You have increased pain.  Your vision becomes blurry.  You have a fever.  You have facial pain, redness, or swelling.  You have new symptoms.  Your symptoms get worse.   This information is not intended to replace advice given to you by your health care provider. Make sure you discuss any questions you have with your health care provider.   Document Released: 01/30/2003 Document Revised: 05/03/2006 Document Reviewed: 08/21/2014 Elsevier Interactive Patient Education [employer]2016 Elsevier Inc.

## 2016-02-06 NOTE — Progress Notes (Signed)
BP 110/78 mmHg  Pulse 100  Temp(Src) 97.7 F (36.5 C) (Oral)  Wt 207 lb (93.895 kg)  LMP 10/07/2015   CC: check eyes  Subjective:    Patient ID: Caroline Fletcher, female    DOB: 15-Dec-1985, 30 y.o.   MRN: [id number]  HPI: Caroline Nashley Hollister is a 30 y.o. female presenting on 02/06/2016 for Conjunctivitis   Woke up this morning with bilateral matted eyes and some discharge. No vision changes. Itching has progressed to tenderness. Today noticed some chest congestion as well as head congestion and sore throat/PNDrainage. No headache.  Yesterday she did have allergic reaction to cat - sneezing and itchy watery red eyes.   + son with pink eye at home (1yo) Pt is [redacted] wks pregnant.  No fevers/chills. Hasn't tried anything for this yet.  H/o asthma but denies dyspnea or wheezing.   Relevant past medical, surgical, family and social history reviewed and updated as indicated. Interim medical history since our last visit reviewed. Allergies and medications reviewed and updated. Current Outpatient Prescriptions on File Prior to Visit  Medication Sig  . cloNIDine (CATAPRES) 0.1 MG tablet Take 0.1 mg by mouth daily.  Marland Kitchen. albuterol (PROVENTIL HFA;VENTOLIN HFA) 108 (90 BASE) MCG/ACT inhaler Inhale 2 puffs into the lungs every 6 (six) hours as needed for wheezing. (Patient not taking: Reported on 02/06/2016)   No current facility-administered medications on file prior to visit.    Review of Systems Per HPI unless specifically indicated in ROS section     Objective:    BP 110/78 mmHg  Pulse 100  Temp(Src) 97.7 F (36.5 C) (Oral)  Wt 207 lb (93.895 kg)  LMP 10/07/2015  Wt Readings from Last 3 Encounters:  02/06/16 207 lb (93.895 kg)  10/17/15 198 lb (89.812 kg)  07/10/15 207 lb (93.895 kg)    Physical Exam  Constitutional: She appears well-developed and well-nourished. No distress.  HENT:  Head: Normocephalic and atraumatic.  Right Ear: Hearing, tympanic membrane, external ear and ear canal  normal.  Left Ear: Hearing, tympanic membrane, external ear and ear canal normal.  Nose: No mucosal edema or rhinorrhea. Right sinus exhibits no maxillary sinus tenderness and no frontal sinus tenderness. Left sinus exhibits no maxillary sinus tenderness and no frontal sinus tenderness.  Mouth/Throat: Uvula is midline, oropharynx is clear and moist and mucous membranes are normal. No oropharyngeal exudate, posterior oropharyngeal edema, posterior oropharyngeal erythema or tonsillar abscesses.  Cobblestoning in OP  Eyes: EOM are normal. Pupils are equal, round, and reactive to light. Right conjunctiva is injected. Left conjunctiva is injected. No scleral icterus.  L>R bulbar conjunctivitis with mild palpebral component  Neck: Normal range of motion. Neck supple.  Cardiovascular: Normal rate, regular rhythm, normal heart sounds and intact distal pulses.   No murmur heard. Pulmonary/Chest: Effort normal and breath sounds normal. No respiratory distress. She has no wheezes. She has no rales.  Lymphadenopathy:    She has no cervical adenopathy.  Skin: Skin is warm and dry. No rash noted.  Nursing note and vitals reviewed.     Assessment & Plan:   Problem List Items Addressed This Visit    Red eyes - Primary    Anticipate combination of allergic conjunctivitis from yesterday's cat exposure and viral conjunctivitis from son exposure.  Treat supportively per instructions - cool compresses, start claritin daily. If worsening daytime discharge/matting, provided with WASP for erythromycin ointment to fill. Pt agrees with plan. [redacted] wks pregnant.  Follow up plan: Return if symptoms worsen or fail to improve.

## 2016-02-06 NOTE — Progress Notes (Signed)
Pre visit review using our clinic review tool, if applicable. No additional management support is needed unless otherwise documented below in the visit note. 

## 2016-02-06 NOTE — Assessment & Plan Note (Signed)
Anticipate combination of allergic conjunctivitis from yesterday's cat exposure and viral conjunctivitis from son exposure.  Treat supportively per instructions - cool compresses, start claritin daily. If worsening daytime discharge/matting, provided with WASP for erythromycin ointment to fill. Pt agrees with plan. [redacted] wks pregnant.

## 2016-04-18 ENCOUNTER — Encounter: Payer: Self-pay | Admitting: *Deleted

## 2016-04-18 ENCOUNTER — Inpatient Hospital Stay
Admission: EM | Admit: 2016-04-18 | Discharge: 2016-04-18 | Disposition: A | Discharge status: Home / Self Care | Payer: BLUE CROSS/BLUE SHIELD | Attending: Obstetrics and Gynecology | Admitting: Obstetrics and Gynecology

## 2016-04-18 DIAGNOSIS — F952 Tourette's disorder: Secondary | ICD-10-CM | POA: Diagnosis not present

## 2016-04-18 DIAGNOSIS — M549 Dorsalgia, unspecified: Secondary | ICD-10-CM | POA: Diagnosis not present

## 2016-04-18 DIAGNOSIS — Z79899 Other long term (current) drug therapy: Secondary | ICD-10-CM | POA: Insufficient documentation

## 2016-04-18 DIAGNOSIS — O26892 Other specified pregnancy related conditions, second trimester: Secondary | ICD-10-CM | POA: Diagnosis present

## 2016-04-18 DIAGNOSIS — Z3A21 21 weeks gestation of pregnancy: Secondary | ICD-10-CM | POA: Insufficient documentation

## 2016-04-18 DIAGNOSIS — R109 Unspecified abdominal pain: Secondary | ICD-10-CM | POA: Diagnosis not present

## 2016-04-18 LAB — URINALYSIS COMPLETE WITH MICROSCOPIC (ARMC ONLY)
Bilirubin Urine: NEGATIVE
Glucose, UA: NEGATIVE mg/dL
HGB URINE DIPSTICK: NEGATIVE
Ketones, ur: NEGATIVE mg/dL
LEUKOCYTES UA: NEGATIVE
NITRITE: NEGATIVE
PH: 6 (ref 5.0–8.0)
Protein, ur: NEGATIVE mg/dL
Specific Gravity, Urine: 1.004 — ABNORMAL LOW (ref 1.005–1.030)

## 2016-04-18 MED ORDER — ACETAMINOPHEN 325 MG PO TABS
650.0000 mg | ORAL_TABLET | ORAL | Status: DC | PRN
  Administered 2016-04-18: 650 mg via ORAL
  Filled 2016-04-18: qty 2

## 2016-04-18 MED ORDER — CALCIUM CARBONATE ANTACID 500 MG PO CHEW: 2.0000 | CHEWABLE_TABLET | ORAL | Status: DC | PRN

## 2016-04-18 NOTE — Discharge Summary (Signed)
Pt d/c'd to home with family. D/c instructions reviewed. [facility]

## 2016-04-18 NOTE — Discharge Instructions (Signed)
Get plenty of rest and drink plenty of fluid.

## 2016-04-18 NOTE — Discharge Summary (Signed)
Obstetric History and Physical  Caroline Fletcher is a 30 y.o. G2P1 with Estimated Date of Delivery: 08/27/16 who presents at 2580w2d presenting for back pressure and abdominal pain. After resting in bed, abdominal pain resolved. Patient states she has been having no vaginal bleeding, intact membranes, with active fetal movement.    Prenatal Course Source of Care: [facility]   Pregnancy complications or risks: Placenta previa  Patient Active Problem List   Diagnosis Date Noted  . Abdominal pressure 04/18/2016  . Red eyes 02/06/2016  . Asthma, chronic 06/15/2013  . TOURETTE'S DISORDER ([facility]) 03/07/2008     Prenatal Transfer Tool   Past Medical History  Diagnosis Date  . Tourette's disorder     Dr. Sandria ManlyLove  . Asthma, chronic 06/15/2013  . Tourette syndrome     Past Surgical History  Procedure Laterality Date  . Myringotomy      bilateral  . Pallet repair  1990    OB History  Gravida Para Term Preterm AB SAB TAB Ectopic Multiple Living  2 1        1     # Outcome Date GA Lbr Len/2nd Weight Sex Delivery Anes PTL Lv  2 Current           1 Para         Y      Social History   Social History  . Marital Status: Married    Spouse Name: N/A  . Number of Children: N/A  . Years of Education: N/A   Social History Main Topics  . Smoking status: Never Smoker   . Smokeless tobacco: Never Used  . Alcohol Use: No     Comment: Occasional  . Drug Use: No  . Sexual Activity: No   Other Topics Concern  . None   Social History Narrative    Family History  Problem Relation Age of Onset  . Healthy Father   . Healthy Mother   . Diabetes Maternal Grandfather   . Stroke Paternal Grandmother   . Heart attack Paternal Grandmother   . Cancer Neg Hx     Prescriptions prior to admission  Medication Sig Dispense Refill Last Dose  . albuterol (PROVENTIL HFA;VENTOLIN HFA) 108 (90 BASE) MCG/ACT inhaler Inhale 2 puffs into the lungs every 6 (six) hours as needed for wheezing. (Patient not  taking: Reported on 02/06/2016) 1 Inhaler 3 Not Taking  . clonazePAM (KLONOPIN) 0.5 MG tablet Take 0.5 mg by mouth 2 (two) times daily as needed for anxiety. Reported on 04/18/2016   Not Taking at Unknown time  . cloNIDine (CATAPRES) 0.1 MG tablet Take 0.1 mg by mouth daily.   Taking  . erythromycin [facility](ROMYCIN) ophthalmic ointment Place 1 application into both eyes at bedtime. (Patient not taking: Reported on 04/18/2016) 3.5 g 0 Not Taking at Unknown time  . loratadine (CLARITIN) 10 MG tablet Take 1 tablet (10 mg total) by mouth daily. 30 tablet    . Prenatal Vit-Fe Fumarate-FA (MULTIVITAMIN-PRENATAL) 27-0.8 MG TABS tablet Take 1 tablet by mouth daily at 12 noon.   Taking    No Known Allergies  Review of Systems: Negative except for what is mentioned in HPI.  Physical Exam: LMP 10/07/2015 GENERAL: Well-developed, well-nourished female in no acute distress.  ABDOMEN: Soft, nontender, nondistended, gravid. Cervical Exam: deferred CVA: no tenderness FHT: +FHTs  Contractions: none   Pertinent Labs/Studies:   Results for orders placed or performed during the hospital encounter of 04/18/16 (from the past 24 hour(s))  Urinalysis complete,  with microscopic ([facility] only)     Status: Abnormal   Collection Time: 04/18/16 10:35 PM  Result Value Ref Range   Color, Urine STRAW (A) YELLOW   APPearance CLEAR (A) CLEAR   Glucose, UA NEGATIVE NEGATIVE mg/dL   Bilirubin Urine NEGATIVE NEGATIVE   Ketones, ur NEGATIVE NEGATIVE mg/dL   Specific Gravity, Urine 1.004 (L) 1.005 - 1.030   Hgb urine dipstick NEGATIVE NEGATIVE   pH 6.0 5.0 - 8.0   Protein, ur NEGATIVE NEGATIVE mg/dL   Nitrite NEGATIVE NEGATIVE   Leukocytes, UA NEGATIVE NEGATIVE   RBC / HPF 0-5 0 - 5 RBC/hpf   WBC, UA 0-5 0 - 5 WBC/hpf   Bacteria, UA MANY (A) NONE SEEN   Squamous Epithelial / LPF 0-5 (A) NONE SEEN    Assessment : IUP at [redacted]w[redacted]d, musculoskeletal discomforts of pregnancy  Plan: Discharge Home - f/u as scheduled for f/u  US

## 2016-04-18 NOTE — OB Triage Note (Signed)
G2P1 presents at 343w2d with c/o abdominal and back pain.

## 2016-05-22 ENCOUNTER — Observation Stay
Admission: EM | Admit: 2016-05-22 | Discharge: 2016-05-22 | Disposition: A | Discharge status: Home / Self Care | Payer: BLUE CROSS/BLUE SHIELD | Attending: Obstetrics & Gynecology | Admitting: Obstetrics & Gynecology

## 2016-05-22 ENCOUNTER — Encounter: Payer: Self-pay | Admitting: *Deleted

## 2016-05-22 DIAGNOSIS — O26899 Other specified pregnancy related conditions, unspecified trimester: Secondary | ICD-10-CM

## 2016-05-22 DIAGNOSIS — N898 Other specified noninflammatory disorders of vagina: Secondary | ICD-10-CM | POA: Insufficient documentation

## 2016-05-22 DIAGNOSIS — O9989 Other specified diseases and conditions complicating pregnancy, childbirth and the puerperium: Principal | ICD-10-CM | POA: Insufficient documentation

## 2016-05-22 DIAGNOSIS — Z3A26 26 weeks gestation of pregnancy: Secondary | ICD-10-CM | POA: Insufficient documentation

## 2016-05-22 MED ORDER — ONDANSETRON HCL 4 MG/2ML IJ SOLN: 4.0000 mg | Freq: Four times a day (QID) | INTRAMUSCULAR | Status: DC | PRN

## 2016-05-22 MED ORDER — ACETAMINOPHEN 325 MG PO TABS: 650.0000 mg | ORAL_TABLET | ORAL | Status: DC | PRN

## 2016-05-22 NOTE — Final Progress Note (Signed)
Physician Final Progress Note  Patient ID: Caroline Fletcher MRN: [id number] DOB/AGE: 21-Dec-1985 30 y.o.  Admit date: 05/22/2016 Admitting provider: Nadara Mustardobert P Ramey Schiff, MD Discharge date: 05/22/2016   Admission Diagnoses: Vaginal Discharge  Discharge Diagnoses:  Active Problems:   * No active hospital problems. *   Same, no ROM  Consults: None  Significant Findings/ Diagnostic Studies: G2P1 at 26 weeks with vag clear discharge today, some cramping, + FM.  No fever.  No recent infection or bladder complaint.  No compl this or prior pregnancy, no h/o PTL. AF, VSS.  Abd gravid and FHT 140s.  SSE no pooling or nitrazine.  SVE cl/th/high. Extr- no edema.   Fern test neg. Toco no ctxs.  Procedures: none  Discharge Condition: good  Disposition: 01-Home or Self Care  Diet: Regular diet  Discharge Activity: Activity as tolerated     Medication List    TAKE these medications        albuterol 108 (90 Base) MCG/ACT inhaler  Commonly known as:  PROVENTIL HFA;VENTOLIN HFA  Inhale 2 puffs into the lungs every 6 (six) hours as needed for wheezing.     clonazePAM 0.5 MG tablet  Commonly known as:  KLONOPIN  Take 0.5 mg by mouth 2 (two) times daily as needed for anxiety. Reported on 04/18/2016     cloNIDine 0.1 MG tablet  Commonly known as:  CATAPRES  Take 0.1 mg by mouth daily.     erythromycin ophthalmic ointment  Commonly known as:  ROMYCIN  Place 1 application into both eyes at bedtime.     loratadine 10 MG tablet  Commonly known as:  CLARITIN  Take 1 tablet (10 mg total) by mouth daily.     multivitamin-prenatal 27-0.8 MG Tabs tablet  Take 1 tablet by mouth daily at 12 noon.           Follow-up Information    Follow up with Letitia Libraobert Paul Anguel Delapena, MD. Go in 1 week.   Specialty:  Obstetrics and Gynecology   Why:  As Scheduled   Contact information:   [address] [place] [address] [phone]       Total time spent taking care of this patient: 15  minutes  Signed: Letitia Libraobert Paul Deward Sebek 05/22/2016, 8:50 PM

## 2016-05-22 NOTE — Discharge Summary (Signed)
Discharge instructions reviewed with patient including follow up appointment and when to seek medical attention. All questions answered. Patient discharged via wheelchair, escorted by spouse. No signs of distress observed at time of discharge.

## 2016-05-22 NOTE — Discharge Summary (Signed)
  See FPN 

## 2016-06-26 ENCOUNTER — Observation Stay
Admission: EM | Admit: 2016-06-26 | Discharge: 2016-06-26 | Disposition: A | Discharge status: Home / Self Care | Payer: BLUE CROSS/BLUE SHIELD | Attending: Obstetrics and Gynecology | Admitting: Obstetrics and Gynecology

## 2016-06-26 DIAGNOSIS — O99343 Other mental disorders complicating pregnancy, third trimester: Secondary | ICD-10-CM | POA: Diagnosis not present

## 2016-06-26 DIAGNOSIS — O9989 Other specified diseases and conditions complicating pregnancy, childbirth and the puerperium: Secondary | ICD-10-CM | POA: Diagnosis present

## 2016-06-26 DIAGNOSIS — O4700 False labor before 37 completed weeks of gestation, unspecified trimester: Secondary | ICD-10-CM | POA: Diagnosis present

## 2016-06-26 DIAGNOSIS — F952 Tourette's disorder: Secondary | ICD-10-CM | POA: Insufficient documentation

## 2016-06-26 DIAGNOSIS — O368130 Decreased fetal movements, third trimester, not applicable or unspecified: Principal | ICD-10-CM | POA: Insufficient documentation

## 2016-06-26 DIAGNOSIS — J45909 Unspecified asthma, uncomplicated: Secondary | ICD-10-CM | POA: Insufficient documentation

## 2016-06-26 DIAGNOSIS — O471 False labor at or after 37 completed weeks of gestation: Secondary | ICD-10-CM | POA: Insufficient documentation

## 2016-06-26 DIAGNOSIS — O99513 Diseases of the respiratory system complicating pregnancy, third trimester: Secondary | ICD-10-CM | POA: Diagnosis not present

## 2016-06-26 DIAGNOSIS — Z3A31 31 weeks gestation of pregnancy: Secondary | ICD-10-CM | POA: Insufficient documentation

## 2016-06-26 NOTE — Discharge Summary (Signed)
Obstetric History and Physical  Caroline Fletcher is a 30 y.o. G2P1 with Estimated Date of Delivery: 08/27/16 who presents at [redacted]w[redacted]d  presenting for abdominal and back pain. Patient states she has been having q 3-4 min contractions, no vaginal bleeding, intact membranes, with decreased fetal movement until arrival, active fetal movement after NST.    Prenatal Course Source of Care: [facility] Pregnancy complications or risks: Patient Active Problem List   Diagnosis Date Noted  . Vaginal discharge during pregnancy 05/22/2016  . TOURETTE'S DISORDER ([facility]) 03/07/2008     Prenatal Transfer Tool   Past Medical History:  Diagnosis Date  . Asthma, chronic 06/15/2013  . Tourette syndrome   . Tourette's disorder    Dr. Sandria Manly    Past Surgical History:  Procedure Laterality Date  . MYRINGOTOMY     bilateral  . Pallet repair  1990    OB History  Gravida Para Term Preterm AB Living  2 1       1   SAB TAB Ectopic Multiple Live Births          1    # Outcome Date GA Lbr Len/2nd Weight Sex Delivery Anes PTL Lv  2 Current           1 Para         LIV      Social History   Social History  . Marital status: Married    Spouse name: N/A  . Number of children: N/A  . Years of education: N/A   Social History Main Topics  . Smoking status: Never Smoker  . Smokeless tobacco: Never Used  . Alcohol use No     Comment: Occasional  . Drug use: No  . Sexual activity: No   Other Topics Concern  . None   Social History Narrative  . None    Family History  Problem Relation Age of Onset  . Healthy Father   . Healthy Mother   . Diabetes Maternal Grandfather   . Stroke Paternal Grandmother   . Heart attack Paternal Grandmother   . Cancer Neg Hx     Prescriptions Prior to Admission  Medication Sig Dispense Refill Last Dose  . cloNIDine (CATAPRES) 0.1 MG tablet Take 0.1 mg by mouth daily.   06/26/2016 at Unknown time  . Prenatal Vit-Fe Fumarate-FA (MULTIVITAMIN-PRENATAL) 27-0.8 MG TABS  tablet Take 1 tablet by mouth daily at 12 noon.   06/26/2016 at Unknown time  Other Meds: Abilify  No Known Allergies  Review of Systems: Negative except for what is mentioned in HPI.  Physical Exam: BP 128/80 (BP Location: Left Arm)   Pulse 100   Temp 98.2 F (36.8 C) (Oral)   Resp 18   Ht 5\' 2"  (1.575 m)   Wt 216 lb (98 kg)   LMP 10/07/2015   BMI 39.51 kg/m  GENERAL: Well-developed, well-nourished female in no acute distress.  LUNGS: Clear to auscultation bilaterally.  HEART: Regular rate and rhythm. ABDOMEN: Soft, nontender, nondistended, gravid. EXTREMITIES: Nontender, no edema Cervical Exam: Dilatation FT cm   Effacement 50 %   Station OOP   Presentation: breech per Leopolds and per [place] yesterday FHT: Category: 1 Baseline rate 145 bpm   Variability moderate  Accelerations present   Decelerations none Contractions: none   Pertinent Labs/Studies:   No results found for this or any previous visit (from the past 24 hour(s)).  Assessment : IUP at [redacted]w[redacted]d, no evidence of preterm labor  Plan: Discharge Home  Encouraged  rest and po hydration F/u as scheduled in 2 wk at office.

## 2016-06-26 NOTE — OB Triage Note (Signed)
Patient came in for observation for labor evaluation. Patient reports contractions every two to three minutes since five this evening. Patient denies leaking of fluid, vaginal bleeding and spotting. Vital signs stable and patient afebrile. FHR baseline 145 with moderate variability with accelerations 15 x 15 and no decelerations. Husband at bedside. Will continue to monitor.

## 2016-06-26 NOTE — Discharge Summary (Signed)
Patient discharged with instructions on follow up appointments, oral hydration, labor precautions, and when to seek medical attention. Patient ambulatory at discharge with steady gait and no complaints. Patient discharged with husband.

## 2016-07-02 ENCOUNTER — Encounter: Payer: Self-pay | Admitting: *Deleted

## 2016-07-02 ENCOUNTER — Observation Stay
Admission: EM | Admit: 2016-07-02 | Discharge: 2016-07-02 | Disposition: A | Discharge status: Home / Self Care | Payer: BLUE CROSS/BLUE SHIELD | Attending: Obstetrics & Gynecology | Admitting: Obstetrics & Gynecology

## 2016-07-02 DIAGNOSIS — Z3A32 32 weeks gestation of pregnancy: Secondary | ICD-10-CM | POA: Insufficient documentation

## 2016-07-02 DIAGNOSIS — J45909 Unspecified asthma, uncomplicated: Secondary | ICD-10-CM | POA: Diagnosis not present

## 2016-07-02 DIAGNOSIS — R109 Unspecified abdominal pain: Secondary | ICD-10-CM

## 2016-07-02 DIAGNOSIS — F952 Tourette's disorder: Secondary | ICD-10-CM | POA: Insufficient documentation

## 2016-07-02 DIAGNOSIS — N898 Other specified noninflammatory disorders of vagina: Secondary | ICD-10-CM | POA: Insufficient documentation

## 2016-07-02 DIAGNOSIS — O26899 Other specified pregnancy related conditions, unspecified trimester: Secondary | ICD-10-CM

## 2016-07-02 DIAGNOSIS — O26893 Other specified pregnancy related conditions, third trimester: Secondary | ICD-10-CM | POA: Diagnosis not present

## 2016-07-02 LAB — URINALYSIS COMPLETE WITH MICROSCOPIC (ARMC ONLY)
BILIRUBIN URINE: NEGATIVE
Glucose, UA: NEGATIVE mg/dL
Hgb urine dipstick: NEGATIVE
KETONES UR: NEGATIVE mg/dL
Leukocytes, UA: NEGATIVE
Nitrite: NEGATIVE
PROTEIN: NEGATIVE mg/dL
RBC / HPF: NONE SEEN RBC/hpf (ref 0–5)
SPECIFIC GRAVITY, URINE: 1.003 — AB (ref 1.005–1.030)
pH: 8 (ref 5.0–8.0)

## 2016-07-02 MED ORDER — ACETAMINOPHEN 325 MG PO TABS: 650.0000 mg | ORAL_TABLET | ORAL | Status: DC | PRN

## 2016-07-02 MED ORDER — CALCIUM CARBONATE ANTACID 500 MG PO CHEW: 2.0000 | CHEWABLE_TABLET | ORAL | Status: DC | PRN

## 2016-07-02 NOTE — Discharge Summary (Signed)
Expand All Collapse All   Hide copied text Hover for attribution information Obstetric History and Physical  Caroline Fletcher is a 30 y.o. G2P1 with Estimated Date of Delivery: 08/27/16 who presents at [redacted]w[redacted]d  presenting for cramping and vaginal irritation. Pt was evaluated for c/o contractions on 8/4 and discharged home with cervix fingertip. Pt reports no vaginal bleeding, intact membranes, with active fetal movement.  Prenatal Course Source of Care: [facility] Pregnancy complications or risks: Tourettes syndrome, on abilify and clonidine. Getting regular growth [place] d/t med exposure.     Patient Active Problem List   Diagnosis Date Noted  . Vaginal discharge during pregnancy 05/22/2016  . TOURETTE'S DISORDER ([facility]) 03/07/2008     Prenatal Transfer Tool       Past Medical History:  Diagnosis Date  . Asthma, chronic 06/15/2013  . Tourette syndrome   . Tourette's disorder    Dr. Sandria Manly         Past Surgical History:  Procedure Laterality Date  . MYRINGOTOMY     bilateral  . Pallet repair  1990                    OB History  Gravida Para Term Preterm AB Living  SAB TAB Ectopic Multiple Live Births          1    # Outcome Date GA Lbr Len/2nd Weight Sex Delivery Anes PTL Lv  2 Current           1 Para         LIV      Social History        Social History  . Marital status: Married    Spouse name: N/A  . Number of children: N/A  . Years of education: N/A         Social History Main Topics  . Smoking status: Never Smoker  . Smokeless tobacco: Never Used  . Alcohol use No     Comment: Occasional  . Drug use: No  . Sexual activity: No       Other Topics Concern  . None      Social History Narrative  . None         Family History  Problem Relation Age of Onset  . Healthy Father   . Healthy Mother   . Diabetes Maternal Grandfather   . Stroke Paternal Grandmother   . Heart attack  Paternal Grandmother   . Cancer Neg Hx            Prescriptions Prior to Admission  Medication Sig Dispense Refill Last Dose  . cloNIDine (CATAPRES) 0.1 MG tablet Take 0.1 mg by mouth daily.   06/26/2016 at Unknown time  . Prenatal Vit-Fe Fumarate-FA (MULTIVITAMIN-PRENATAL) 27-0.8 MG TABS tablet Take 1 tablet by mouth daily at 12 noon.   06/26/2016 at Unknown time  Other Meds: Abilify  No Known Allergies  Review of Systems: Negative except for what is mentioned in HPI.  Physical Exam: BP: 130/72, 98.9 GENERAL: Well-developed, well-nourished female in no acute distress.  LUNGS: Clear to auscultation bilaterally.  HEART: Regular rate and rhythm. ABDOMEN: Soft, nontender, nondistended, gravid. EXTREMITIES: Nontender, no edema Cervical Exam: Dilatation FT cm   Effacement 50 %   Station OOP   Presentation: vertex per Leopolds SSE: wet mount negative FHT: Category: 1 Baseline rate 145 bpm   Variability moderate  Accelerations present   Decelerations none  Contractions: none   Pertinent Labs/Studies:   Lab Results Last 24 Hours  No results found for this or any previous visit (from the past 24 hour(s)).    Assessment : IUP at 218w0d, no evidence of preterm labor  Plan: Discharge Home after UA resulted F/u as scheduled in 2 wk at office.           Revision History

## 2016-07-02 NOTE — Discharge Summary (Signed)
Discharge instructions reviewed with patient including follow up appointment, signs of preterm labor and when to seek medical attention. All questions answered. Patient discharged home, ambulatory with steady gait, no signs of distress observed at time of discharge.  

## 2016-07-15 ENCOUNTER — Observation Stay
Admission: EM | Admit: 2016-07-15 | Discharge: 2016-07-15 | Disposition: A | Discharge status: Home / Self Care | Payer: BLUE CROSS/BLUE SHIELD | Attending: Obstetrics and Gynecology | Admitting: Obstetrics and Gynecology

## 2016-07-15 DIAGNOSIS — Z3A00 Weeks of gestation of pregnancy not specified: Secondary | ICD-10-CM | POA: Insufficient documentation

## 2016-07-15 DIAGNOSIS — O4700 False labor before 37 completed weeks of gestation, unspecified trimester: Secondary | ICD-10-CM | POA: Diagnosis present

## 2016-07-15 NOTE — Discharge Summary (Addendum)
Patient discharged with instructions on follow up appointment, labor precautions, and when to seek medical attention. Patient ambulatory at discharge with steady gait and no complications. Patient discharged with mother.

## 2016-07-15 NOTE — OB Triage Note (Signed)
Patient came in for observation for labor evaluation. Patient reports contractions irregular uterine contractions. Patient denies leaking of fluid, vaginal bleeding and spotting. Vital signs stable and patient afebrile. FHR baseline 135 with moderate variability with accelerations 15 x 15 and no decelerations. Mother at bedside. Will continue to monitor.

## 2016-07-29 ENCOUNTER — Observation Stay
Admission: RE | Admit: 2016-07-29 | Discharge: 2016-07-29 | Disposition: A | Discharge status: Home / Self Care | Payer: BLUE CROSS/BLUE SHIELD | Attending: Obstetrics and Gynecology | Admitting: Obstetrics and Gynecology

## 2016-07-29 DIAGNOSIS — O26893 Other specified pregnancy related conditions, third trimester: Secondary | ICD-10-CM | POA: Diagnosis not present

## 2016-07-29 DIAGNOSIS — Z3A35 35 weeks gestation of pregnancy: Secondary | ICD-10-CM | POA: Insufficient documentation

## 2016-07-29 DIAGNOSIS — O26899 Other specified pregnancy related conditions, unspecified trimester: Secondary | ICD-10-CM | POA: Diagnosis present

## 2016-07-29 DIAGNOSIS — R102 Pelvic and perineal pain: Secondary | ICD-10-CM | POA: Insufficient documentation

## 2016-07-29 NOTE — Final Progress Note (Signed)
Physician Final Progress Note  Patient ID: Caroline Fletcher MRN: [id number] DOB/AGE: 03/26/1986 30 y.o.  Admit date: 07/29/2016 Admitting provider: Vena AustriaAndreas Maxima Skelton, MD Discharge date: 07/29/2016   Admission Diagnoses: Pelvic pressure  Discharge Diagnoses:  Active Problems:   Pelvic pressure in pregnancy, antepartum   Consults: None  Significant Findings/ Diagnostic Studies: none  Procedures: Reactive NST, cat I tracing.  FHT 130, moderate variability, +accels, no decels  Discharge Condition: good  Disposition: 01-Home or Self Care  Diet: Regular diet  Discharge Activity: Activity as tolerated  Discharge Instructions    Discharge activity:  No Restrictions    Complete by:  As directed   Discharge diet:  No restrictions    Complete by:  As directed   Fetal Kick Count:  Lie on our left side for one hour after a meal, and count the number of times your baby kicks.  If it is less than 5 times, get up, move around and drink some juice.  Repeat the test 30 minutes later.  If it is still less than 5 kicks in an hour, notify your doctor.    Complete by:  As directed   LABOR:  When conractions begin, you should start to time them from the beginning of one contraction to the beginning  of the next.  When contractions are 5 - 10 minutes apart or less and have been regular for at least an hour, you should call your health care provider.    Complete by:  As directed   No sexual activity restrictions    Complete by:  As directed   Notify physician for bleeding from the vagina    Complete by:  As directed   Notify physician for blurring of vision or spots before the eyes    Complete by:  As directed   Notify physician for chills or fever    Complete by:  As directed   Notify physician for fainting spells, "black outs" or loss of consciousness    Complete by:  As directed   Notify physician for increase in vaginal discharge    Complete by:  As directed   Notify physician for leaking of fluid     Complete by:  As directed   Notify physician for pain or burning when urinating    Complete by:  As directed   Notify physician for pelvic pressure (sudden increase)    Complete by:  As directed   Notify physician for severe or continued nausea or vomiting    Complete by:  As directed   Notify physician for sudden gushing of fluid from the vagina (with or without continued leaking)    Complete by:  As directed   Notify physician for sudden, constant, or occasional abdominal pain    Complete by:  As directed   Notify physician if baby moving less than usual    Complete by:  As directed       Medication List    TAKE these medications   ARIPiprazole 2 MG tablet Commonly known as:  ABILIFY Take 4 mg by mouth daily.   cloNIDine 0.1 MG tablet Commonly known as:  CATAPRES Take 0.05 mg by mouth daily.   multivitamin-prenatal 27-0.8 MG Tabs tablet Take 1 tablet by mouth daily at 12 noon.        Total time spent taking care of this patient: 20 minutes  Signed: Lorrene ReidSTAEBLER, Briyanna Billingham M 07/29/2016, 6:31 PM

## 2016-07-29 NOTE — OB Triage Note (Signed)
Pt presents to L&D with pelvic pressure, leaking fluid and occasional contractions since this am.  No bleeding and positive fetal movement.

## 2016-07-29 NOTE — Discharge Instructions (Signed)
Call MD office with any concerns.  Keep scheduled follow up appt.  Drink plenty for fluid.

## 2016-08-07 ENCOUNTER — Encounter: Payer: Self-pay | Admitting: *Deleted

## 2016-08-07 ENCOUNTER — Inpatient Hospital Stay
Admission: EM | Admit: 2016-08-07 | Discharge: 2016-08-07 | Disposition: A | Discharge status: Home / Self Care | Payer: BLUE CROSS/BLUE SHIELD | Attending: Obstetrics and Gynecology | Admitting: Obstetrics and Gynecology

## 2016-08-07 DIAGNOSIS — Z3A37 37 weeks gestation of pregnancy: Secondary | ICD-10-CM | POA: Insufficient documentation

## 2016-08-07 DIAGNOSIS — O368130 Decreased fetal movements, third trimester, not applicable or unspecified: Secondary | ICD-10-CM | POA: Insufficient documentation

## 2016-08-07 NOTE — OB Triage Note (Signed)
Patient states that she has not felt baby move as much today as normal but that she is moving and last moved about one hour ago, pt states she has occasional contractions that are not painful. Denies any other needs at this time.

## 2016-08-07 NOTE — Discharge Instructions (Signed)
Call your provider for any other concerns. Drink plenty of fluid and get plenty of rest.

## 2016-08-07 NOTE — Final Progress Note (Signed)
Physician Final Progress Note  Patient ID: Caroline Fletcher MRN: [id number] DOB/AGE: 20-Aug-1986 30 y.o.  Admit date: 08/07/2016 Admitting provider: Vena AustriaAndreas Staebler, MD/ Gasper Lloydolleen L. Sharen HonesGutierrez, CNM Discharge date: 08/07/2016   Admission Diagnoses: IUP at 37wk1d with decreased fetal movement  Discharge Diagnoses: Reactive NST Consults:   Significant Findings/ Diagnostic Studies: 30 year old G2 P1001 with EDC=08/27/2016 by a 6wk1d ultrasound presents to L&D at 37.1 weeks with decreased FM this AM. Had a reactive NST yesterday. [facility] remarkable for Obesity, and Tourettet's syndrome (currently on Abilify, clonidine).  Having some irregular contractions and has been having right sciatic type pain. Nonstress test today is nicely reactive with baseline 140s and acceleration to 180 with moderate variability. One contraction seen in 40 min. Cervix: ext os 1 cm and internal os closed.  Procedures: NST  Discharge Condition: good  Disposition: 01-Home or Self Care  Diet: regular  Discharge Activity: Activity as tolerated ; FKCs daily     Medication List    TAKE these medications   ARIPiprazole 2 MG tablet Commonly known as:  ABILIFY Take 4 mg by mouth daily.   cloNIDine 0.1 MG tablet Commonly known as:  CATAPRES Take 0.05 mg by mouth daily.   multivitamin-prenatal 27-0.8 MG Tabs tablet Take 1 tablet by mouth daily at 12 noon.      Follow-up Information    [facility] on 08/14/2016.   Contact information: [address] [place] [address] [phone]          [facility] Total time spent taking care of this patient: 15 minutes  Signed: Farrel ConnersGUTIERREZ, Harold Mattes 08/07/2016, 5:43 PM

## 2016-08-18 ENCOUNTER — Observation Stay
Admission: EM | Admit: 2016-08-18 | Discharge: 2016-08-18 | Disposition: A | Discharge status: Home / Self Care | Payer: BLUE CROSS/BLUE SHIELD | Attending: Certified Nurse Midwife | Admitting: Certified Nurse Midwife

## 2016-08-18 DIAGNOSIS — Z79899 Other long term (current) drug therapy: Secondary | ICD-10-CM | POA: Diagnosis not present

## 2016-08-18 DIAGNOSIS — J45909 Unspecified asthma, uncomplicated: Secondary | ICD-10-CM | POA: Insufficient documentation

## 2016-08-18 DIAGNOSIS — Z0379 Encounter for other suspected maternal and fetal conditions ruled out: Secondary | ICD-10-CM | POA: Diagnosis present

## 2016-08-18 DIAGNOSIS — O471 False labor at or after 37 completed weeks of gestation: Secondary | ICD-10-CM | POA: Diagnosis not present

## 2016-08-18 DIAGNOSIS — Z823 Family history of stroke: Secondary | ICD-10-CM | POA: Diagnosis not present

## 2016-08-18 DIAGNOSIS — O99353 Diseases of the nervous system complicating pregnancy, third trimester: Secondary | ICD-10-CM | POA: Diagnosis not present

## 2016-08-18 DIAGNOSIS — Z833 Family history of diabetes mellitus: Secondary | ICD-10-CM | POA: Insufficient documentation

## 2016-08-18 DIAGNOSIS — Z3A38 38 weeks gestation of pregnancy: Secondary | ICD-10-CM | POA: Insufficient documentation

## 2016-08-18 DIAGNOSIS — O368190 Decreased fetal movements, unspecified trimester, not applicable or unspecified: Secondary | ICD-10-CM | POA: Diagnosis present

## 2016-08-18 DIAGNOSIS — O99513 Diseases of the respiratory system complicating pregnancy, third trimester: Secondary | ICD-10-CM | POA: Insufficient documentation

## 2016-08-18 DIAGNOSIS — Z8249 Family history of ischemic heart disease and other diseases of the circulatory system: Secondary | ICD-10-CM | POA: Insufficient documentation

## 2016-08-18 DIAGNOSIS — Z9889 Other specified postprocedural states: Secondary | ICD-10-CM | POA: Diagnosis not present

## 2016-08-18 DIAGNOSIS — F952 Tourette's disorder: Secondary | ICD-10-CM | POA: Insufficient documentation

## 2016-08-18 NOTE — OB Triage Note (Signed)
Recvd to OBS 4 per wheelchair with c/o leaking fluid.  Changed to gown and to bed.  EFM applied and oriented to room.  Verbalized understanding. Agrees with plan of care.

## 2016-08-18 NOTE — Discharge Summary (Signed)
Obstetric History and Physical  Caroline Fletcher is a 30 y.o. G2P1 with Estimated Date of Delivery: 08/27/16 who presents at 11070w5d  presenting for leaking fluid. Patient states she has been having irregulr contractions, no vaginal bleeding, with active fetal movement.     Prenatal Transfer Tool   Past Medical History:  Diagnosis Date  . Asthma, chronic 06/15/2013  . Tourette syndrome   . Tourette's disorder    Dr. Sandria ManlyLove    Past Surgical History:  Procedure Laterality Date  . MYRINGOTOMY     bilateral  . Pallet repair  1990    OB History  Gravida Para Term Preterm AB Living  2 1       1   SAB TAB Ectopic Multiple Live Births          1    # Outcome Date GA Lbr Len/2nd Weight Sex Delivery Anes PTL Lv  2 Current           1 Para         LIV      Social History   Social History  . Marital status: Married    Spouse name: N/A  . Number of children: N/A  . Years of education: N/A   Social History Main Topics  . Smoking status: Never Smoker  . Smokeless tobacco: Never Used  . Alcohol use No     Comment: Occasional  . Drug use: No  . Sexual activity: No   Other Topics Concern  . Not on file   Social History Narrative  . No narrative on file    Family History  Problem Relation Age of Onset  . Healthy Father   . Healthy Mother   . Diabetes Maternal Grandfather   . Stroke Paternal Grandmother   . Heart attack Paternal Grandmother   . Cancer Neg Hx     Prescriptions Prior to Admission  Medication Sig Dispense Refill Last Dose  . ARIPiprazole (ABILIFY) 2 MG tablet Take 4 mg by mouth daily.   07/29/2016 at Unknown time  . cloNIDine (CATAPRES) 0.1 MG tablet Take 0.05 mg by mouth daily.    07/29/2016 at Unknown time  . Prenatal Vit-Fe Fumarate-FA (MULTIVITAMIN-PRENATAL) 27-0.8 MG TABS tablet Take 1 tablet by mouth daily at 12 noon.   07/29/2016 at Unknown time    No Known Allergies  Review of Systems: Negative except for what is mentioned in HPI.  Physical Exam: BP  123/70 (BP Location: Left Arm)   Pulse 98   Temp 98.3 F (36.8 C) (Oral)   Resp 16   Ht 5\' 2"  (1.575 m)   Wt 220 lb (99.8 kg)   LMP 10/07/2015   BMI 40.24 kg/m  GENERAL: Well-developed, well-nourished female in no acute distress.  LUNGS: unlabored breathing ABDOMEN: Soft, nontender, nondistended, gravid. EXTREMITIES: Nontender, no edema Cervical Exam: Dilatation 1cm   Effacement thick, nitrizine negative per RN Bedside AFI per T. Margo AyeHall, RN: 8.2 cm   Presentation: cephalic FHT: Category: 1 Baseline rate 140 bpm   Variability moderate  Accelerations present   Decelerations none Contractions: irregular   Pertinent Labs/Studies:   No results found for this or any previous visit (from the past 24 hour(s)).  Assessment : IUP at 5170w5d no evidence of SROM  Plan: Discharge Home  Keep appt for growth US on 9/28 and IOL for clonidine exposure on 9/29

## 2016-08-21 ENCOUNTER — Observation Stay
Admission: EM | Admit: 2016-08-21 | Discharge: 2016-08-21 | Disposition: A | Discharge status: Home / Self Care | Payer: Medicaid Other | Source: Home / Self Care | Admitting: Obstetrics & Gynecology

## 2016-08-21 DIAGNOSIS — O479 False labor, unspecified: Secondary | ICD-10-CM

## 2016-08-21 MED ORDER — ACETAMINOPHEN 325 MG PO TABS: 650.0000 mg | ORAL_TABLET | ORAL | Status: DC | PRN

## 2016-08-21 MED ORDER — ONDANSETRON HCL 4 MG/2ML IJ SOLN: 4.0000 mg | Freq: Four times a day (QID) | INTRAMUSCULAR | Status: DC | PRN

## 2016-08-21 NOTE — Discharge Instructions (Signed)
Come in tomorrow around 8am through the emergency department for an induction of labor.

## 2016-08-21 NOTE — OB Triage Note (Signed)
Patient presents here with new symptom of lower back discomfort that radiates to lower abdomen. Rates pain a 4/10. Patient reports positive fetal movement. Denies vaginal bleeding.

## 2016-08-22 ENCOUNTER — Inpatient Hospital Stay
Admission: EM | Admit: 2016-08-22 | Discharge: 2016-08-25 | DRG: 775 | Disposition: A | Discharge status: Home / Self Care | Payer: Medicaid Other | Attending: Obstetrics and Gynecology | Admitting: Obstetrics and Gynecology

## 2016-08-22 ENCOUNTER — Encounter: Payer: Self-pay | Admitting: *Deleted

## 2016-08-22 DIAGNOSIS — O99824 Streptococcus B carrier state complicating childbirth: Secondary | ICD-10-CM | POA: Diagnosis present

## 2016-08-22 DIAGNOSIS — Z8249 Family history of ischemic heart disease and other diseases of the circulatory system: Secondary | ICD-10-CM

## 2016-08-22 DIAGNOSIS — Z823 Family history of stroke: Secondary | ICD-10-CM

## 2016-08-22 DIAGNOSIS — Z833 Family history of diabetes mellitus: Secondary | ICD-10-CM | POA: Diagnosis not present

## 2016-08-22 DIAGNOSIS — O99354 Diseases of the nervous system complicating childbirth: Secondary | ICD-10-CM | POA: Diagnosis present

## 2016-08-22 DIAGNOSIS — Z3A39 39 weeks gestation of pregnancy: Secondary | ICD-10-CM

## 2016-08-22 DIAGNOSIS — F952 Tourette's disorder: Secondary | ICD-10-CM | POA: Diagnosis present

## 2016-08-22 LAB — TYPE AND SCREEN
ABO/RH(D): O POS
ANTIBODY SCREEN: NEGATIVE

## 2016-08-22 LAB — CBC
HCT: 34.8 % — ABNORMAL LOW (ref 35.0–47.0)
Hemoglobin: 12.4 g/dL (ref 12.0–16.0)
MCH: 29.7 pg (ref 26.0–34.0)
MCHC: 35.7 g/dL (ref 32.0–36.0)
MCV: 83.3 fL (ref 80.0–100.0)
PLATELETS: 202 10*3/uL (ref 150–440)
RBC: 4.18 MIL/uL (ref 3.80–5.20)
RDW: 14.3 % (ref 11.5–14.5)
WBC: 10.5 10*3/uL (ref 3.6–11.0)

## 2016-08-22 MED ORDER — TERBUTALINE SULFATE 1 MG/ML IJ SOLN: 0.2500 mg | Freq: Once | INTRAMUSCULAR | Status: DC | PRN

## 2016-08-22 MED ORDER — LIDOCAINE HCL (PF) 1 % IJ SOLN
30.0000 mL | INTRAMUSCULAR | Status: DC | PRN
  Filled 2016-08-22: qty 30

## 2016-08-22 MED ORDER — DEXTROSE 5 % IV SOLN
5.0000 10*6.[IU] | Freq: Once | INTRAVENOUS | Status: AC
  Administered 2016-08-22: 5 10*6.[IU] via INTRAVENOUS
  Filled 2016-08-22: qty 5

## 2016-08-22 MED ORDER — ARIPIPRAZOLE 2 MG PO TABS
4.0000 mg | ORAL_TABLET | Freq: Every day | ORAL | Status: DC
  Administered 2016-08-23: 4 mg via ORAL
  Filled 2016-08-22: qty 2

## 2016-08-22 MED ORDER — CLONIDINE HCL 0.1 MG PO TABS
0.0500 mg | ORAL_TABLET | Freq: Every day | ORAL | Status: DC
Start: 2016-08-23 — End: 2016-08-23
  Filled 2016-08-22: qty 0.5

## 2016-08-22 MED ORDER — DINOPROSTONE 10 MG VA INST
10.0000 mg | VAGINAL_INSERT | Freq: Once | VAGINAL | Status: AC
  Administered 2016-08-22: 10 mg via VAGINAL
  Filled 2016-08-22: qty 1

## 2016-08-22 MED ORDER — CALCIUM CARBONATE ANTACID 500 MG PO CHEW
2.0000 | CHEWABLE_TABLET | Freq: Three times a day (TID) | ORAL | Status: DC | PRN
  Administered 2016-08-22: 200 mg via ORAL
  Filled 2016-08-22: qty 2

## 2016-08-22 MED ORDER — ONDANSETRON HCL 4 MG/2ML IJ SOLN
4.0000 mg | Freq: Four times a day (QID) | INTRAMUSCULAR | Status: DC | PRN
  Administered 2016-08-23: 4 mg via INTRAVENOUS
  Filled 2016-08-22: qty 2

## 2016-08-22 MED ORDER — OXYTOCIN 40 UNITS IN LACTATED RINGERS INFUSION - SIMPLE MED
2.5000 [IU]/h | INTRAVENOUS | Status: DC
  Filled 2016-08-22: qty 1000

## 2016-08-22 MED ORDER — ACETAMINOPHEN 325 MG PO TABS: 650.0000 mg | ORAL_TABLET | ORAL | Status: DC | PRN

## 2016-08-22 MED ORDER — BUTORPHANOL TARTRATE 1 MG/ML IJ SOLN
1.0000 mg | INTRAMUSCULAR | Status: DC | PRN
Start: 2016-08-22 — End: 2016-08-23

## 2016-08-22 MED ORDER — OXYTOCIN BOLUS FROM INFUSION
500.0000 mL | Freq: Once | INTRAVENOUS | Status: AC
  Administered 2016-08-23: 500 mL via INTRAVENOUS

## 2016-08-22 MED ORDER — LACTATED RINGERS IV SOLN
INTRAVENOUS | Status: DC
  Administered 2016-08-22 – 2016-08-23 (×3): via INTRAVENOUS

## 2016-08-22 MED ORDER — AMMONIA AROMATIC IN INHA
RESPIRATORY_TRACT | Status: AC
  Filled 2016-08-22: qty 10

## 2016-08-22 MED ORDER — SOD CITRATE-CITRIC ACID 500-334 MG/5ML PO SOLN: 30.0000 mL | ORAL | Status: DC | PRN

## 2016-08-22 MED ORDER — LIDOCAINE HCL (PF) 1 % IJ SOLN
INTRAMUSCULAR | Status: AC
  Filled 2016-08-22: qty 30

## 2016-08-22 MED ORDER — MISOPROSTOL 200 MCG PO TABS
ORAL_TABLET | ORAL | Status: AC
  Filled 2016-08-22: qty 4

## 2016-08-22 MED ORDER — PENICILLIN G POTASSIUM 5000000 UNITS IJ SOLR
2.5000 10*6.[IU] | INTRAVENOUS | Status: DC
  Administered 2016-08-22 – 2016-08-23 (×4): 2.5 10*6.[IU] via INTRAVENOUS
  Filled 2016-08-22 (×10): qty 2.5

## 2016-08-22 MED ORDER — OXYTOCIN 40 UNITS IN LACTATED RINGERS INFUSION - SIMPLE MED
1.0000 m[IU]/min | INTRAVENOUS | Status: DC
  Administered 2016-08-22: 2 m[IU]/min via INTRAVENOUS

## 2016-08-22 MED ORDER — LACTATED RINGERS IV SOLN
500.0000 mL | INTRAVENOUS | Status: DC | PRN
  Administered 2016-08-23: 500 mL via INTRAVENOUS

## 2016-08-22 NOTE — Discharge Summary (Signed)
  See FPN 

## 2016-08-22 NOTE — H&P (Signed)
Obstetric H&P   Chief Complaint: Induction of labor  Prenatal Care Provider: [facility]  History of Present Illness: 30 y.o. G2P1 5669w2d by 08/27/2016, date by 6 week Ultrasound presenting to L&D for scheduled induction of labor secondary to Tourette's Syndrome and clonidine exposure this pregnancy.  +FM, no LOF, no VB   Growth scan on 08/20/2016 at 39 weeks 3534g 7lbs 13oz c/w 63.5%ile AFI 9.66cm  O pos / ABSC neg / RI / VZI / RPR NR / HIV neg / HBsAg neg / 1-hr 140 3-hr 75 / 126 / 102/ 98 / GBS positive  Review of Systems: 10 point review of systems negative unless otherwise noted in HPI  Past Medical History: Past Medical History:  Diagnosis Date  . Asthma, chronic 06/15/2013  . Tourette syndrome   . Tourette's disorder    Dr. Zola ButtonHaq    Past Surgical History: Past Surgical History:  Procedure Laterality Date  . MYRINGOTOMY     bilateral  . Pallet repair  1990   Family History: Family History  Problem Relation Age of Onset  . Healthy Father   . Healthy Mother   . Diabetes Maternal Grandfather   . Stroke Paternal Grandmother   . Heart attack Paternal Grandmother   . Cancer Neg Hx     Social History: Social History   Social History  . Marital status: Married    Spouse name: N/A  . Number of children: N/A  . Years of education: N/A   Occupational History  . Not on file.   Social History Main Topics  . Smoking status: Never Smoker  . Smokeless tobacco: Never Used  . Alcohol use No     Comment: Occasional  . Drug use: No  . Sexual activity: No   Other Topics Concern  . Not on file   Social History Narrative  . No narrative on file    Medications: Prior to Admission medications   Medication Sig Start Date End Date Taking? Authorizing Provider  ARIPiprazole (ABILIFY) 2 MG tablet Take 4 mg by mouth daily.   Yes Historical Provider, MD  cloNIDine (CATAPRES) 0.1 MG tablet Take 0.05 mg by mouth daily.    Yes Historical Provider, MD  Prenatal Vit-Fe Fumarate-FA  (MULTIVITAMIN-PRENATAL) 27-0.8 MG TABS tablet Take 1 tablet by mouth daily at 12 noon.   Yes Historical Provider, MD    Allergies: No Known Allergies  Physical Exam: Vitals: Blood pressure 120/71, pulse 93, temperature 98 F (36.7 C), temperature source Oral, resp. rate 14, last menstrual period 10/07/2015.  Urine Dip Protein: N/A  FHT: 140, moderate, +accels, no decels Toco: none  General: NAD HEENT: normocephalic, anicteric Pulmonary: no increased work of breathing Cardiovascular: RRR Abdomen: Gravid, non-tender Leopolds: vtx Genitourinary: closed Extremities: no edema  Labs: No results found for this or any previous visit (from the past 24 hour(s)).  Assessment: 30 y.o. G2P1 4469w2d by 08/27/2016, by 6 week Ultrasound IOL for Tourettes with clonidine expsoure  Plan: 1) Labor - start IOL with cervidil  2) Fetus - cat I tracing  3) PNL - O pos / ABSC neg / RI / VZI / RPR NR / HIV neg / HBsAg neg / 1-hr 140 3-hr 75 / 126 / 102/ 98 / GBS positive  4) TDAP - 06/25/16  5) Tourette's Syndrome - continue abilify and clonidine  6) Disposition - pending delivery

## 2016-08-22 NOTE — Final Progress Note (Signed)
Physician Final Progress Note  Patient ID: Gregery Nashley Daddona MRN: [id number] DOB/AGE: 02/18/1986 30 y.o.  Admit date: 08/21/2016 Admitting provider: Nadara Mustardobert P Destin Kittler, MD Discharge date: 08/22/2016   Admission Diagnoses: Labor symptoms  Discharge Diagnoses:  Active Problems:   Normal labor   Consults: None  Significant Findings/ Diagnostic Studies: Pt had irreg ctxs, no cervical change.  No VB or ROM. Fetal well being reassuring.  Procedures: A NST procedure was performed with FHR monitoring and a normal baseline established, appropriate time of 20-40 minutes of evaluation, and accels >2 seen w 15x15 characteristics.  Results show a REACTIVE NST.   Discharge Condition: good  Disposition: 01-Home or Self Care  Diet: Regular diet  Discharge Activity: Activity as tolerated  Total time spent taking care of this patient: TRIAGE  Patient presented for evaluation of labor.  Patient had cervical exam by RN and this was reported to me. I reviewed her vital signs and fetal tracing, both of which were reassuring.  Patient was discharge as she was not laboring.   Signed: Letitia Libraobert Paul Colton Engdahl 08/22/2016, 2:08 AM

## 2016-08-23 ENCOUNTER — Inpatient Hospital Stay: Payer: Medicaid Other | Admitting: Anesthesiology

## 2016-08-23 LAB — RPR: RPR: NONREACTIVE

## 2016-08-23 MED ORDER — FENTANYL 2.5 MCG/ML W/ROPIVACAINE 0.2% IN NS 100 ML EPIDURAL INFUSION (ARMC-ANES)
EPIDURAL | Status: AC
  Filled 2016-08-23: qty 100

## 2016-08-23 MED ORDER — NALOXONE HCL 2 MG/2ML IJ SOSY: 1.0000 ug/kg/h | PREFILLED_SYRINGE | INTRAVENOUS | Status: DC | PRN

## 2016-08-23 MED ORDER — NALBUPHINE HCL 10 MG/ML IJ SOLN: 5.0000 mg | INTRAMUSCULAR | Status: DC | PRN

## 2016-08-23 MED ORDER — KETOROLAC TROMETHAMINE 30 MG/ML IJ SOLN: 30.0000 mg | Freq: Four times a day (QID) | INTRAMUSCULAR | Status: DC | PRN

## 2016-08-23 MED ORDER — ACETAMINOPHEN 325 MG PO TABS: 650.0000 mg | ORAL_TABLET | ORAL | Status: DC | PRN

## 2016-08-23 MED ORDER — LIDOCAINE-EPINEPHRINE (PF) 1.5 %-1:200000 IJ SOLN
INTRAMUSCULAR | Status: DC | PRN
  Administered 2016-08-23: 3 mL

## 2016-08-23 MED ORDER — FENTANYL 2.5 MCG/ML W/ROPIVACAINE 0.2% IN NS 100 ML EPIDURAL INFUSION (ARMC-ANES): 10.0000 mL/h | EPIDURAL | Status: DC

## 2016-08-23 MED ORDER — DIPHENHYDRAMINE HCL 25 MG PO CAPS: 25.0000 mg | ORAL_CAPSULE | ORAL | Status: DC | PRN

## 2016-08-23 MED ORDER — ACETAMINOPHEN 325 MG PO TABS: 650.0000 mg | ORAL_TABLET | Freq: Four times a day (QID) | ORAL | Status: DC

## 2016-08-23 MED ORDER — IBUPROFEN 600 MG PO TABS
600.0000 mg | ORAL_TABLET | Freq: Four times a day (QID) | ORAL | Status: DC
  Administered 2016-08-23 – 2016-08-25 (×7): 600 mg via ORAL
  Filled 2016-08-23 (×7): qty 1

## 2016-08-23 MED ORDER — ONDANSETRON HCL 4 MG/2ML IJ SOLN: 4.0000 mg | Freq: Three times a day (TID) | INTRAMUSCULAR | Status: DC | PRN

## 2016-08-23 MED ORDER — DIPHENHYDRAMINE HCL 50 MG/ML IJ SOLN: 12.5000 mg | INTRAMUSCULAR | Status: DC | PRN

## 2016-08-23 MED ORDER — OXYCODONE-ACETAMINOPHEN 5-325 MG PO TABS: 1.0000 | ORAL_TABLET | ORAL | Status: DC | PRN

## 2016-08-23 MED ORDER — FENTANYL 2.5 MCG/ML W/ROPIVACAINE 0.2% IN NS 100 ML EPIDURAL INFUSION (ARMC-ANES)
EPIDURAL | Status: DC | PRN
  Administered 2016-08-23: 10 mL/h via EPIDURAL

## 2016-08-23 MED ORDER — NALOXONE HCL 0.4 MG/ML IJ SOLN: 0.4000 mg | INTRAMUSCULAR | Status: DC | PRN

## 2016-08-23 MED ORDER — NALBUPHINE HCL 10 MG/ML IJ SOLN: 5.0000 mg | Freq: Once | INTRAMUSCULAR | Status: DC | PRN

## 2016-08-23 MED ORDER — MEPERIDINE HCL 25 MG/ML IJ SOLN: 6.2500 mg | INTRAMUSCULAR | Status: DC | PRN

## 2016-08-23 MED ORDER — SODIUM CHLORIDE 0.9 % IV SOLN
INTRAVENOUS | Status: DC | PRN
  Administered 2016-08-23 (×3): 5 mL via EPIDURAL

## 2016-08-23 MED ORDER — OXYCODONE-ACETAMINOPHEN 5-325 MG PO TABS: 2.0000 | ORAL_TABLET | ORAL | Status: DC | PRN

## 2016-08-23 MED ORDER — CLONIDINE HCL 0.1 MG PO TABS
0.0500 mg | ORAL_TABLET | Freq: Every day | ORAL | Status: DC
  Administered 2016-08-23 – 2016-08-25 (×3): 0.05 mg via ORAL
  Filled 2016-08-23: qty 1
  Filled 2016-08-23: qty 0.5

## 2016-08-23 MED ORDER — SODIUM CHLORIDE 0.9% FLUSH: 3.0000 mL | INTRAVENOUS | Status: DC | PRN

## 2016-08-23 MED ORDER — BUPIVACAINE HCL (PF) 0.25 % IJ SOLN
INTRAMUSCULAR | Status: DC | PRN
Start: 2016-08-23 — End: 2016-08-23
  Administered 2016-08-23 (×2): 5 mL via EPIDURAL

## 2016-08-23 MED ORDER — SCOPOLAMINE 1 MG/3DAYS TD PT72: 1.0000 | MEDICATED_PATCH | Freq: Once | TRANSDERMAL | Status: DC

## 2016-08-23 MED ORDER — DIPHENHYDRAMINE HCL 25 MG PO CAPS: 25.0000 mg | ORAL_CAPSULE | Freq: Four times a day (QID) | ORAL | Status: DC | PRN

## 2016-08-23 NOTE — Anesthesia Procedure Notes (Signed)
Epidural Patient location during procedure: OB Start time: 08/23/2016 3:10 AM End time: 08/23/2016 3:33 AM  Staffing Anesthesiologist: Alver FisherPENWARDEN, Nikiesha Milford Performed: anesthesiologist   Preanesthetic Checklist Completed: patient identified, site marked, surgical consent, pre-op evaluation, timeout performed, IV checked, risks and benefits discussed and monitors and equipment checked  Epidural Patient position: sitting Prep: ChloraPrep Patient monitoring: heart rate, continuous pulse ox and blood pressure Approach: midline Location: L4-L5 Injection technique: LOR saline  Needle:  Needle type: Tuohy  Needle gauge: 18 G Needle length: 9 cm and 9 Needle insertion depth: 7.5 cm Catheter type: closed end flexible Catheter size: 20 Guage Catheter at skin depth: 12 cm Test dose: negative (0.125% bupivacaine)  Assessment Events: blood not aspirated, injection not painful, no injection resistance, negative IV test and no paresthesia  Additional Notes   Patient tolerated the insertion well without complications.Reason for block:procedure for pain

## 2016-08-23 NOTE — Discharge Summary (Signed)
Obstetric Discharge Summary  Reason for Admission: induction of labor Prenatal Procedures: none Intrapartum Procedures: spontaneous vaginal delivery Postpartum Procedures: none Complications-Operative and Postpartum: none   Hemoglobin  Date Value Ref Range Status  08/22/2016 12.4 12.0 - 16.0 g/dL Final   HGB  Date Value Ref Range Status  10/30/2014 11.9 (L) 12.0 - 16.0 g/dL Final   HCT  Date Value Ref Range Status  08/22/2016 34.8 (L) 35.0 - 47.0 % Final  11/02/2014 21.4 (L) 35.0 - 47.0 % Final   BP 119/75 (BP Location: Left Arm)   Pulse 75   Temp 97.8 F (36.6 C) (Oral)   Resp 18   Ht 5\' 2"  (1.575 m)   Wt 226 lb (102.5 kg)   LMP 10/07/2015   SpO2 100%   BMI 41.34 kg/m  Physical Exam:   General: alert, appears stated age and no distress Lochia: appropriate Uterine Fundus: firm DVT Evaluation: No evidence of DVT seen on physical exam.  Discharge Diagnoses: Term Pregnancy-delivered  Discharge Information: Date: 08/23/2016 Activity: pelvic rest Diet: routine Medications: PNV, Abilify, Catapres Condition: stable Discharge to: home O+, Rubella Immune, Varicella Immune, TDAP UTD Bottle feeding/Contraception: uncertain, may consider IUD or Nexplanon and knows to order prior to 6 week visit  Newborn Data: Live born female Birth Weight: 7 lb 2.6 oz (3250 g) APGAR: 8, 9  Home with mother.  STAEBLER, ANDREAS M 08/23/2016, 11:16 AM   Updated prior to discharge Cadence Minton, CNM

## 2016-08-23 NOTE — Anesthesia Preprocedure Evaluation (Signed)
Anesthesia Evaluation  Patient identified by MRN, date of birth, ID band Patient awake    Reviewed: Allergy & Precautions, NPO status , Patient's Chart, lab work & pertinent test results  History of Anesthesia Complications Negative for: history of anesthetic complications  Airway Mallampati: II  TM Distance: >3 FB Neck ROM: Full    Dental no notable dental hx.    Pulmonary asthma (mild intermittent) , neg sleep apnea, neg COPD,    breath sounds clear to auscultation- rhonchi (-) wheezing      Cardiovascular Exercise Tolerance: Good (-) hypertension(-) CAD and (-) Past MI  Rhythm:Regular Rate:Normal - Systolic murmurs and - Diastolic murmurs    Neuro/Psych PSYCHIATRIC DISORDERS (Tourette's syndrome) negative neurological ROS     GI/Hepatic negative GI ROS, Neg liver ROS,   Endo/Other  negative endocrine ROSneg diabetes  Renal/GU negative Renal ROS     Musculoskeletal   Abdominal (+) + obese,   Peds  Hematology negative hematology ROS (+)   Anesthesia Other Findings IOL due to clonidine use for Tourette's, no hx of HTN  Reproductive/Obstetrics (+) Pregnancy                             Anesthesia Physical Anesthesia Plan  ASA: II  Anesthesia Plan: Epidural   Post-op Pain Management:    Induction:   Airway Management Planned:   Additional Equipment:   Intra-op Plan:   Post-operative Plan:   Informed Consent: I have reviewed the patients History and Physical, chart, labs and discussed the procedure including the risks, benefits and alternatives for the proposed anesthesia with the patient or authorized representative who has indicated his/her understanding and acceptance.     Plan Discussed with: Anesthesiologist  Anesthesia Plan Comments:         Lab Results  Component Value Date   WBC 10.5 08/22/2016   HGB 12.4 08/22/2016   HCT 34.8 (L) 08/22/2016   MCV 83.3  08/22/2016   PLT 202 08/22/2016    Anesthesia Quick Evaluation

## 2016-08-23 NOTE — Progress Notes (Addendum)
Subjective:  Comfortable, epidural redosed at anterior lip Objective:   Vitals: Blood pressure 112/61, pulse (!) 105, temperature 98.6 F (37 C), temperature source Oral, resp. rate 16, height 5\' 2"  (1.575 m), weight 226 lb (102.5 kg), last menstrual period 10/07/2015, SpO2 100 %. General:  Abdomen: Cervical Exam:  Dilation: 10 Dilation Complete Date: 08/23/16 Dilation Complete Time: 0918 Effacement (%): 100 Cervical Position: Posterior Station: +1, +2 Presentation: Vertex Exam by:: Johaan Ryser  FHT: 170, moderate variability, no accels, occasional variable with on late appearing contractions following FSE placement Toco: q1-932min  Results for orders placed or performed during the hospital encounter of 08/22/16 (from the past 24 hour(s))  CBC     Status: Abnormal   Collection Time: 08/22/16 10:30 AM  Result Value Ref Range   WBC 10.5 3.6 - 11.0 K/uL   RBC 4.18 3.80 - 5.20 MIL/uL   Hemoglobin 12.4 12.0 - 16.0 g/dL   HCT 16.134.8 (L) 09.635.0 - 04.547.0 %   MCV 83.3 80.0 - 100.0 fL   MCH 29.7 26.0 - 34.0 pg   MCHC 35.7 32.0 - 36.0 g/dL   RDW 40.914.3 81.111.5 - 91.414.5 %   Platelets 202 150 - 440 K/uL    Assessment:   30 y.o. G2P1 179w3d IOL for clonidine exposure secondary to Tourettes Syndrome  Plan:   1) Labor - discontinue pitocin  2) Fetus - category II tracing secondary to fetal tachycardia, no fever

## 2016-08-24 LAB — CBC
HEMATOCRIT: 33.4 % — AB (ref 35.0–47.0)
HEMOGLOBIN: 11.3 g/dL — AB (ref 12.0–16.0)
MCH: 28.8 pg (ref 26.0–34.0)
MCHC: 33.9 g/dL (ref 32.0–36.0)
MCV: 85.1 fL (ref 80.0–100.0)
Platelets: 196 10*3/uL (ref 150–440)
RBC: 3.92 MIL/uL (ref 3.80–5.20)
RDW: 14.5 % (ref 11.5–14.5)
WBC: 14.6 10*3/uL — AB (ref 3.6–11.0)

## 2016-08-24 MED ORDER — ONDANSETRON HCL 4 MG PO TABS: 4.0000 mg | ORAL_TABLET | ORAL | Status: DC | PRN

## 2016-08-24 MED ORDER — SENNOSIDES-DOCUSATE SODIUM 8.6-50 MG PO TABS
2.0000 | ORAL_TABLET | ORAL | Status: DC
  Administered 2016-08-25: 2 via ORAL
  Filled 2016-08-24: qty 2

## 2016-08-24 MED ORDER — BENZOCAINE-MENTHOL 20-0.5 % EX AERO: 1.0000 "application " | INHALATION_SPRAY | CUTANEOUS | Status: DC | PRN

## 2016-08-24 MED ORDER — ARIPIPRAZOLE 2 MG PO TABS
4.0000 mg | ORAL_TABLET | Freq: Every day | ORAL | Status: DC
  Administered 2016-08-24 – 2016-08-25 (×2): 4 mg via ORAL
  Filled 2016-08-24 (×2): qty 2

## 2016-08-24 MED ORDER — WITCH HAZEL-GLYCERIN EX PADS: 1.0000 "application " | MEDICATED_PAD | CUTANEOUS | Status: DC | PRN

## 2016-08-24 MED ORDER — SIMETHICONE 80 MG PO CHEW: 80.0000 mg | CHEWABLE_TABLET | ORAL | Status: DC | PRN

## 2016-08-24 MED ORDER — ONDANSETRON HCL 4 MG/2ML IJ SOLN: 4.0000 mg | INTRAMUSCULAR | Status: DC | PRN

## 2016-08-24 MED ORDER — DIBUCAINE 1 % RE OINT: 1.0000 "application " | TOPICAL_OINTMENT | RECTAL | Status: DC | PRN

## 2016-08-24 MED ORDER — PRENATAL MULTIVITAMIN CH
1.0000 | ORAL_TABLET | Freq: Every day | ORAL | Status: DC
  Administered 2016-08-24 – 2016-08-25 (×2): 1 via ORAL
  Filled 2016-08-24 (×2): qty 1

## 2016-08-24 MED ORDER — COCONUT OIL OIL
1.0000 | TOPICAL_OIL | Status: DC | PRN
Start: 2016-08-24 — End: 2016-08-25

## 2016-08-24 NOTE — Anesthesia Postprocedure Evaluation (Signed)
Anesthesia Post Note  Patient: Caroline Fletcher  Procedure(s) Performed: * No procedures listed *  Patient location during evaluation: Mother Baby Anesthesia Type: Epidural Level of consciousness: awake and alert and oriented Pain management: pain level controlled Vital Signs Assessment: post-procedure vital signs reviewed and stable Respiratory status: spontaneous breathing Cardiovascular status: stable Postop Assessment: no headache, no signs of nausea or vomiting and adequate PO intake    Last Vitals:  Vitals:   08/24/16 0040 08/24/16 0403  BP: (!) 89/47 (!) 126/57  Pulse: 88 76  Resp:    Temp: 36.6 C 36.5 C    Last Pain:  Vitals:   08/24/16 0403  TempSrc: Oral  PainSc:                  Rica MastBachich,  Jovan Schickling M

## 2016-08-24 NOTE — Progress Notes (Signed)
Post Partum Day 1 Subjective: no complaints, voiding, tolerating PO and baby doing well. Bottle feeding  Objective: Blood pressure 111/70, pulse 75, temperature 97.5 F (36.4 C), temperature source Oral, resp. rate 18, height 5\' 2"  (1.575 m), weight 102.5 kg (226 lb), last menstrual period 10/07/2015, SpO2 99 %, unknown if currently breastfeeding.  Physical Exam:  General: alert, cooperative and no distress Lochia: appropriate Uterine Fundus: firm/ U-1/ML/NT DVT Evaluation: No evidence of DVT seen on physical exam. No significant calf/ankle edema.   Recent Labs  08/22/16 1030 08/24/16 0600  HGB 12.4 11.3*  HCT 34.8* 33.4*  WBC 10.5 14.6*  PLT 202 196    Assessment/Plan: Plan for discharge tomorrow  Restarted Abilify and clonidine Bottle O POS/ RI/ VI/ GBS positive Caroline Fletcher TDAP UTD Contraception: undecided   LOS: 2 days   Caroline Fletcher 08/24/2016, 11:15 AM

## 2016-08-25 NOTE — Progress Notes (Signed)
Parents viewed the video, The Period of purple Cry prior to discharge home.  Copy given to them for discharge.

## 2016-08-25 NOTE — Progress Notes (Signed)
MD order for pt d/c home.  Pt given all d/c instructions and understands all.  No Rx's given.  Pt d/cd home with baby via Auxillary by wheelchair.

## 2016-09-29 ENCOUNTER — Ambulatory Visit: Payer: BLUE CROSS/BLUE SHIELD | Admitting: Family Medicine

## 2016-09-30 ENCOUNTER — Ambulatory Visit: Payer: Self-pay | Admitting: Family Medicine

## 2016-09-30 DIAGNOSIS — Z0289 Encounter for other administrative examinations: Secondary | ICD-10-CM

## 2017-01-21 ENCOUNTER — Ambulatory Visit (INDEPENDENT_AMBULATORY_CARE_PROVIDER_SITE_OTHER): Payer: Medicaid Other | Admitting: Family Medicine

## 2017-01-21 ENCOUNTER — Encounter: Payer: Self-pay | Admitting: Family Medicine

## 2017-01-21 VITALS — BP 149/92 | HR 86 | Temp 98.7°F | Ht 62.5 in | Wt 222.2 lb

## 2017-01-21 DIAGNOSIS — R600 Localized edema: Secondary | ICD-10-CM

## 2017-01-21 DIAGNOSIS — N912 Amenorrhea, unspecified: Secondary | ICD-10-CM

## 2017-01-21 DIAGNOSIS — R030 Elevated blood-pressure reading, without diagnosis of hypertension: Secondary | ICD-10-CM

## 2017-01-21 LAB — POCT URINE PREGNANCY: PREG TEST UR: NEGATIVE

## 2017-01-21 NOTE — Progress Notes (Signed)
Pre visit review using our clinic review tool, if applicable. No additional management support is needed unless otherwise documented below in the visit note. 

## 2017-01-21 NOTE — Progress Notes (Signed)
Dr. Karleen Hampshire T. Chinonso Linker, MD, CAQ Sports [facility] [address] [place], [address] Phone: [phone] Fax: [phone]  01/21/2017  Patient: Caroline Fletcher, MRN: [id number], DOB: Oct 01, 1986, 31 y.o.  Primary Physician:  Hannah Beat, MD   Chief Complaint  Patient presents with  . Edema    Ankles and Hands   Subjective:   Caroline Fletcher is a 31 y.o. very pleasant female patient who presents with the following:  The patient is 6 months postpartum.  She has Tourette's syndrome, and was recently restarted on Abilify about 2 weeks ago.  She has been gaining some weight, and her weight is up to 222 pounds, and from a postpartum weight, she got down to 200 pounds.  BMI is 40.  She is been having some increasing swelling in her hands and feet bilaterally over the last 2 weeks.  B ankle swelling and some hand swelling.  Has been walking more.   29 months post-partum.  On Abilify.  Not breastfeeding.    Past Medical History, Surgical History, Social History, Family History, Problem List, Medications, and Allergies have been reviewed and updated if relevant.  Patient Active Problem List   Diagnosis Date Noted  . Asthma, chronic 06/15/2013  . TOURETTE'S DISORDER ([facility]) 03/07/2008    Past Medical History:  Diagnosis Date  . Asthma, chronic 06/15/2013  . Tourette syndrome   . Tourette's disorder    Dr. Zola Button    Past Surgical History:  Procedure Laterality Date  . MYRINGOTOMY     bilateral  . Pallet repair  1990    Social History   Social History  . Marital status: Married    Spouse name: N/A  . Number of children: N/A  . Years of education: N/A   Occupational History  . Not on file.   Social History Main Topics  . Smoking status: Never Smoker  . Smokeless tobacco: Never Used  . Alcohol use No     Comment: Occasional  . Drug use: No  . Sexual activity: No   Other Topics Concern  . Not on file   Social History  Narrative  . No narrative on file    Family History  Problem Relation Age of Onset  . Healthy Father   . Healthy Mother   . Diabetes Maternal Grandfather   . Stroke Paternal Grandmother   . Heart attack Paternal Grandmother   . Cancer Neg Hx     No Known Allergies  Medication list reviewed and updated in full in [facility].   GEN: No acute illnesses, no fevers, chills. GI: No n/v/d, eating normally Pulm: No SOB Interactive and getting along well at home.  Otherwise, ROS is as per the HPI.  Objective:   BP (!) 149/92   Pulse 86   Temp 98.7 F (37.1 C) (Oral)   Ht 5' 2.5" (1.588 m)   Wt 222 lb 4 oz (100.8 kg)   LMP 11/23/2016 (Approximate)   Breastfeeding? No   BMI 40.00 kg/m   GEN: WDWN, NAD, Non-toxic, A & O x 3 HEENT: Atraumatic, Normocephalic. Neck supple. No masses, No LAD. Ears and Nose: No external deformity. CV: RRR, No M/G/R. No JVD. No thrill. No extra heart sounds. PULM: CTA B, no wheezes, crackles, rhonchi. No retractions. No resp. distress. No accessory muscle use. EXTR: tr UE edema, 1+ LE edema NEURO Normal gait.  PSYCH: Normally interactive. Conversant. Not depressed or anxious appearing.  Calm demeanor.  Laboratory and Imaging Data: Results for orders placed or performed in visit on 01/21/17  POCT urine pregnancy  Result Value Ref Range   Preg Test, Ur Negative Negative     Assessment and Plan:   Fluid collection (edema) in the arms, legs, hands and feet - Plan: TSH, Hepatic function panel, CBC with Differential/Platelet, Brain natriuretic peptide, Basic Metabolic Panel (BMET), Urinalysis, Routine w reflex microscopic, CANCELED: Basic metabolic panel, CANCELED: CBC with Differential/Platelet, CANCELED: Brain natriuretic peptide, CANCELED: Hepatic function panel, CANCELED: TSH, CANCELED: Urinalysis, Routine w reflex microscopic  Amenorrhea - Plan: POCT urine pregnancy, TSH, Hepatic function panel, CBC with Differential/Platelet, Basic  Metabolic Panel (BMET), Urinalysis, Routine w reflex microscopic, CANCELED: Basic metabolic panel, CANCELED: CBC with Differential/Platelet, CANCELED: Brain natriuretic peptide, CANCELED: Hepatic function panel, CANCELED: TSH, CANCELED: Urinalysis, Routine w reflex microscopic  Elevated blood pressure reading - Plan: TSH, Hepatic function panel, CBC with Differential/Platelet, Brain natriuretic peptide, Basic Metabolic Panel (BMET), Urinalysis, Routine w reflex microscopic  >25 minutes spent in face to face time with patient, >50% spent in counselling or coordination of care   New onset edema of unclear origin.  The patient is not pregnant.  Obtain basic laboratories to evaluate renal, hepatic function as well as a cardiac BNP.  Elevated blood pressure.  If all labs are normal, we could consider placing the patient on hydrochlorothiazide, but I recommended to her that she go on some form of birth control if we are going to do this.  Follow-up: No Follow-up on file.  Medications Discontinued During This Encounter  Medication Reason  . Prenatal Vit-Fe Fumarate-FA (MULTIVITAMIN-PRENATAL) 27-0.8 MG TABS tablet Completed Course   Orders Placed This Encounter  Procedures  . TSH  . Hepatic function panel  . CBC with Differential/Platelet  . Brain natriuretic peptide  . Basic Metabolic Panel (BMET)  . Urinalysis, Routine w reflex microscopic  . POCT urine pregnancy    Signed,  Bassheva Flury T. Patsye Sullivant, MD   Allergies as of 01/21/2017   No Known Allergies     Medication List       Accurate as of 01/21/17 11:59 PM. Always use your most recent med list.          ARIPiprazole 2 MG tablet Commonly known as:  ABILIFY Take 2 mg by mouth daily.   cloNIDine 0.1 MG tablet Commonly known as:  CATAPRES Take 0.05 mg by mouth daily.

## 2017-01-23 LAB — CBC WITH DIFFERENTIAL/PLATELET
BASOS: 0 %
Basophils Absolute: 0 10*3/uL (ref 0.0–0.2)
EOS (ABSOLUTE): 0.2 10*3/uL (ref 0.0–0.4)
Eos: 1 %
HEMATOCRIT: 34.7 % (ref 34.0–46.6)
Hemoglobin: 11.3 g/dL (ref 11.1–15.9)
IMMATURE GRANULOCYTES: 0 %
Immature Grans (Abs): 0 10*3/uL (ref 0.0–0.1)
LYMPHS ABS: 3.3 10*3/uL — AB (ref 0.7–3.1)
Lymphs: 29 %
MCH: 26.2 pg — ABNORMAL LOW (ref 26.6–33.0)
MCHC: 32.6 g/dL (ref 31.5–35.7)
MCV: 80 fL (ref 79–97)
MONOS ABS: 0.7 10*3/uL (ref 0.1–0.9)
Monocytes: 7 %
NEUTROS PCT: 63 %
Neutrophils Absolute: 7.1 10*3/uL — ABNORMAL HIGH (ref 1.4–7.0)
PLATELETS: 300 10*3/uL (ref 150–379)
RBC: 4.32 x10E6/uL (ref 3.77–5.28)
RDW: 13 % (ref 12.3–15.4)
WBC: 11.3 10*3/uL — ABNORMAL HIGH (ref 3.4–10.8)

## 2017-01-23 LAB — HEPATIC FUNCTION PANEL
ALBUMIN: 4.2 g/dL (ref 3.5–5.5)
ALK PHOS: 69 IU/L (ref 39–117)
ALT: 25 IU/L (ref 0–32)
AST: 19 IU/L (ref 0–40)
BILIRUBIN, DIRECT: 0.07 mg/dL (ref 0.00–0.40)
Bilirubin Total: <0.2 mg/dL (ref 0.0–1.2)
Total Protein: 6.7 g/dL (ref 6.0–8.5)

## 2017-01-23 LAB — BRAIN NATRIURETIC PEPTIDE: BNP: 25.3 pg/mL (ref 0.0–100.0)

## 2017-01-23 LAB — BASIC METABOLIC PANEL
BUN / CREAT RATIO: 11 (ref 9–23)
BUN: 8 mg/dL (ref 6–20)
CO2: 27 mmol/L (ref 18–29)
CREATININE: 0.74 mg/dL (ref 0.57–1.00)
Calcium: 9 mg/dL (ref 8.7–10.2)
Chloride: 99 mmol/L (ref 96–106)
GFR, EST AFRICAN AMERICAN: 127 mL/min/{1.73_m2} (ref >59)
GFR, EST NON AFRICAN AMERICAN: 111 mL/min/{1.73_m2} (ref >59)
Glucose: 103 mg/dL — ABNORMAL HIGH (ref 65–99)
POTASSIUM: 3.7 mmol/L (ref 3.5–5.2)
SODIUM: 142 mmol/L (ref 134–144)

## 2017-01-23 LAB — TSH: TSH: 7.58 u[IU]/mL — ABNORMAL HIGH (ref 0.450–4.500)

## 2017-01-25 ENCOUNTER — Telehealth: Payer: Self-pay | Admitting: *Deleted

## 2017-01-25 ENCOUNTER — Telehealth: Payer: Self-pay | Admitting: Family Medicine

## 2017-01-25 MED ORDER — HYDROCHLOROTHIAZIDE 12.5 MG PO CAPS: 12.5000 mg | ORAL_CAPSULE | Freq: Every day | ORAL | 3 refills | Status: DC

## 2017-01-25 NOTE — Telephone Encounter (Signed)
-----   Message from Hannah BeatSpencer Copland, MD sent at 01/25/2017  9:41 AM EST ----- Please call:  Most labs are fine Mildly elevated glucose at 103 - normal if she had eaten  Thyroid tests mildly abnormal - often this falsely abnormal. We should repeat in 1 month's time.   With BP and swelling, I think we should start a mild diuretic.   HCTZ 12.5 mg, 1 po daily, #30, 3 ref

## 2017-01-25 NOTE — Telephone Encounter (Signed)
Patient called to get her lab results. 

## 2017-01-25 NOTE — Addendum Note (Signed)
Addended by: Alvina ChouWALSH, TERRI J on: 01/25/2017 08:58 AM   Modules accepted: Orders

## 2017-01-25 NOTE — Telephone Encounter (Signed)
See lab notes

## 2017-01-25 NOTE — Addendum Note (Signed)
Addended by: Alvina ChouWALSH, TERRI J on: 01/25/2017 08:56 AM   Modules accepted: Orders

## 2017-01-25 NOTE — Telephone Encounter (Signed)
Morrie Sheldonshley notified as instructed by telephone. Patient had eaten prior to labs that day. [facility] [address] [place]  Lab appointment scheduled for 03/01/2017 at 8:20 am to recheck thyroid labs.  Will forward note to Dr. Patsy Lageropland to order future labs.

## 2017-02-02 ENCOUNTER — Ambulatory Visit: Payer: Medicaid Other | Admitting: Family Medicine

## 2017-02-02 ENCOUNTER — Telehealth: Payer: Self-pay | Admitting: Family Medicine

## 2017-02-02 NOTE — Telephone Encounter (Signed)
Yes , charge, no follow up ned unless feeling bad still.

## 2017-02-02 NOTE — Telephone Encounter (Signed)
Yes

## 2017-02-02 NOTE — Telephone Encounter (Signed)
Pt did not show up for their acute visit.  Do you want to charge No Show Fee? °

## 2017-02-22 ENCOUNTER — Other Ambulatory Visit: Payer: Self-pay | Admitting: Family Medicine

## 2017-02-22 DIAGNOSIS — E038 Other specified hypothyroidism: Secondary | ICD-10-CM

## 2017-02-24 ENCOUNTER — Ambulatory Visit (INDEPENDENT_AMBULATORY_CARE_PROVIDER_SITE_OTHER): Payer: Self-pay | Admitting: Family

## 2017-02-24 ENCOUNTER — Encounter: Payer: Self-pay | Admitting: Family

## 2017-02-24 VITALS — BP 120/78 | HR 90 | Temp 97.8°F | Ht 62.5 in | Wt 220.0 lb

## 2017-02-24 DIAGNOSIS — M7989 Other specified soft tissue disorders: Secondary | ICD-10-CM

## 2017-02-24 DIAGNOSIS — R110 Nausea: Secondary | ICD-10-CM | POA: Insufficient documentation

## 2017-02-24 NOTE — Assessment & Plan Note (Signed)
Started yesterday. Benign abdominal exam. Advised conservative therapy. Negative pregnancy. Will let me know if doesn't improve on its own.

## 2017-02-24 NOTE — Patient Instructions (Signed)
Swelling looks great.  Limit salt.   Frequent small, bland snacks ( crackers, nuts, cheese) are good for nausea  Anything with ginger as well.  Let me know if you don't feel better

## 2017-02-24 NOTE — Progress Notes (Signed)
Pre visit review using our clinic review tool, if applicable. No additional management support is needed unless otherwise documented below in the visit note. 

## 2017-02-24 NOTE — Assessment & Plan Note (Signed)
Resolved at this time on hctz. Advised low salt. Patient will follow up with PCP.

## 2017-02-24 NOTE — Progress Notes (Signed)
Subjective:    Patient ID: Caroline Fletcher, female    DOB: [birth year], 31 y.o.   MRN: [id number]  CC: Caroline Fletcher is a 31 y.o. female who presents today for an acute visit.    HPI: CC: LE swelling x month and half, improved since been on HCTZ. 'best it's been today.' Didn't start HCTZ 'right away' and now sittng more at work, so started taking medication 5 days ago and finally 'kicking in.' NO SOB, wheezing. Eating fair amount of salt.  Better in the morning and worse at end of the day. Improves with elevation and after sleep.   Complains also of nausea  Yesterday morning and one episode of dry heaving Thinks it may be 'stress related'.  Feels about the same today. Negative home pregnancy. NO severe abdominal, no bloody emesis, dysuria, breast tenderness.   Had IUD removed a couple of months ago; has been using condoms.           HISTORY:  Past Medical History:  Diagnosis Date  . Asthma, chronic 06/15/2013  . Tourette syndrome   . Tourette's disorder    Dr. Zola Button   Past Surgical History:  Procedure Laterality Date  . MYRINGOTOMY     bilateral  . Pallet repair  1990   Family History  Problem Relation Age of Onset  . Healthy Father   . Healthy Mother   . Diabetes Maternal Grandfather   . Stroke Paternal Grandmother   . Heart attack Paternal Grandmother   . Cancer Neg Hx     Allergies: Patient has no known allergies. Current Outpatient Prescriptions on File Prior to Visit  Medication Sig Dispense Refill  . ARIPiprazole (ABILIFY) 2 MG tablet Take 2 mg by mouth daily.     . cloNIDine (CATAPRES) 0.1 MG tablet Take 0.05 mg by mouth daily.     . hydrochlorothiazide (MICROZIDE) 12.5 MG capsule Take 1 capsule (12.5 mg total) by mouth daily. 30 capsule 3   No current facility-administered medications on file prior to visit.     Social History  Substance Use Topics  . Smoking status: Never Smoker  . Smokeless tobacco: Never Used  . Alcohol use No     Comment: Occasional     Review of Systems  Constitutional: Negative for chills and fever.  Respiratory: Negative for cough.   Cardiovascular: Positive for leg swelling. Negative for chest pain and palpitations.  Gastrointestinal: Positive for nausea. Negative for abdominal distention, abdominal pain, blood in stool, constipation, diarrhea and vomiting.      Objective:    BP 120/78   Pulse 90   Temp 97.8 F (36.6 C) (Oral)   Ht 5' 2.5" (1.588 m)   Wt 220 lb (99.8 kg)   LMP 02/01/2017   SpO2 98%   BMI 39.60 kg/m    Physical Exam  Constitutional: She appears well-developed and well-nourished.  Eyes: Conjunctivae are normal.  Cardiovascular: Normal rate, regular rhythm, normal heart sounds and normal pulses.   Pulmonary/Chest: Effort normal and breath sounds normal. She has no wheezes. She has no rhonchi. She has no rales.  Abdominal: Soft. Normal appearance and bowel sounds are normal. She exhibits no distension, no fluid wave, no ascites and no mass. There is no tenderness. There is no rigidity, no rebound, no guarding and no CVA tenderness.  Neurological: She is alert.  Skin: Skin is warm and dry.  Psychiatric: She has a normal mood and affect. Her speech is normal and behavior is normal. Thought content normal.  Vitals reviewed.      Assessment & Plan:   Problem List Items Addressed This Visit      Other   Nausea - Primary    Started yesterday. Benign abdominal exam. Advised conservative therapy. Negative pregnancy. Will let me know if doesn't improve on its own.       Relevant Orders   POCT urine pregnancy (Completed)   Leg swelling    Resolved at this time on hctz. Advised low salt. Patient will follow up with PCP.             I am having Caroline Fletcher maintain her cloNIDine, ARIPiprazole, and hydrochlorothiazide.   No orders of the defined types were placed in this encounter.   Return precautions given.   Risks, benefits, and alternatives of the medications and treatment  plan prescribed today were discussed, and patient expressed understanding.   Education regarding symptom management and diagnosis given to patient on AVS.  Continue to follow with Hannah Beat, MD for routine health maintenance.   Gregery Na and I agreed with plan.   Rennie Plowman, FNP

## 2017-02-25 LAB — POCT URINE PREGNANCY: Preg Test, Ur: NEGATIVE

## 2017-03-01 ENCOUNTER — Other Ambulatory Visit (INDEPENDENT_AMBULATORY_CARE_PROVIDER_SITE_OTHER): Payer: Self-pay

## 2017-03-01 DIAGNOSIS — E038 Other specified hypothyroidism: Secondary | ICD-10-CM

## 2017-03-01 NOTE — Addendum Note (Signed)
Addended by: Natassia Guthridge A on: 03/01/2017 08:15 AM   Modules accepted: Orders  

## 2017-03-01 NOTE — Addendum Note (Signed)
Addended by: Samule Life A on: 03/01/2017 08:15 AM   Modules accepted: Orders  

## 2017-03-01 NOTE — Addendum Note (Signed)
Addended by: Felix Ahmadi A on: 03/01/2017 08:15 AM   Modules accepted: Orders

## 2017-03-02 LAB — T4, FREE: FREE T4: 1.15 ng/dL (ref 0.82–1.77)

## 2017-03-02 LAB — T3, FREE: T3 FREE: 3.1 pg/mL (ref 2.0–4.4)

## 2017-03-02 LAB — TSH: TSH: 3.81 u[IU]/mL (ref 0.450–4.500)

## 2017-03-03 ENCOUNTER — Emergency Department
Admission: EM | Admit: 2017-03-03 | Discharge: 2017-03-03 | Disposition: A | Discharge status: Home / Self Care | Payer: Medicaid Other | Attending: Emergency Medicine | Admitting: Emergency Medicine

## 2017-03-03 ENCOUNTER — Telehealth: Payer: Self-pay | Admitting: Family Medicine

## 2017-03-03 ENCOUNTER — Encounter: Payer: Self-pay | Admitting: Emergency Medicine

## 2017-03-03 DIAGNOSIS — Z79899 Other long term (current) drug therapy: Secondary | ICD-10-CM | POA: Insufficient documentation

## 2017-03-03 DIAGNOSIS — J45909 Unspecified asthma, uncomplicated: Secondary | ICD-10-CM | POA: Insufficient documentation

## 2017-03-03 DIAGNOSIS — K644 Residual hemorrhoidal skin tags: Secondary | ICD-10-CM

## 2017-03-03 MED ORDER — HYDROCORTISONE ACETATE 25 MG RE SUPP: 25.0000 mg | Freq: Two times a day (BID) | RECTAL | 0 refills | Status: DC

## 2017-03-03 NOTE — ED Triage Notes (Signed)
Pt c/o blood  In stool x2 this am, is supposed to started her menstrual cycle this week and is having some abd cramping, denies any vaginal bleeding. States is might just be hemorrhoids.

## 2017-03-03 NOTE — Telephone Encounter (Signed)
Patient Name: Caroline Fletcher DOB: 15-Jul-1986 Initial Comment Caller states she would like to make an appt today - She has blood in her stool. Nurse Assessment Nurse: Charna Elizabeth, RN, Cathy Date/Time (Eastern Time): 03/03/2017 10:14:44 AM Confirm and document reason for call. If symptomatic, describe symptoms. ---[name] states she noticed blood in her stool this morning. No injury in the past 3 days. No fever. She has had lower, bilateral abdominal cramping today (pain rated as a 4 on the 1 to 10 scale). Alert and responsive. Does the patient have any new or worsening symptoms? ---Yes Will a triage be completed? ---Yes Related visit to physician within the last 2 weeks? ---No Does the PT have any chronic conditions? (i.e. diabetes, asthma, etc.) ---Yes List chronic conditions. ---Tourette's Syndrome, Swelling of her ankles in the past Is the patient pregnant or possibly pregnant? (Ask all females between the ages of 37-55) ---No Is this a behavioral health or substance abuse call? ---No Guidelines Guideline Title Affirmed Question Affirmed Notes Rectal Bleeding [1] MODERATE rectal bleeding (small blood clots, passing blood without stool, or toilet water turns red) AND [2] more than once a day Final Disposition User Go to ED Now Charna Elizabeth, RN, [employer] Referrals [facility] - ED Disagree/Comply: Comply

## 2017-03-03 NOTE — ED Notes (Signed)
Pt reports that she is having bright red bleeding from her rectum x2 this am - blood noticed when she wipes on the toilet paper - pt reports she could have hemorrhoids

## 2017-03-03 NOTE — Discharge Instructions (Signed)
Use anusol suppository twice daily,.   Use colace 100 mg twice daily as needed to soften your stool   Recommend sitz bath (warm bath) three times daily to help shrink the hemorrhoids.  Expect some blood in stool for several days   See your doctor  Return to ER if you have severe rectal bleeding, severe rectal pain, vomiting, abdominal pain

## 2017-03-03 NOTE — ED Provider Notes (Signed)
[facility]-EMERGENCY DEPARTMENT Provider Note   CSN: [id number] Arrival date & time: 03/03/17  1053     History   Chief Complaint Chief Complaint  Patient presents with  . Blood In Stools    HPI Caroline Fletcher is a 31 y.o. female history of hemorrhoids who presenting with blood in stool. Patient states she had harder stool today and was straining and noticed some blood in her stool in the toilet bowl as well as when she wipes. Patient states that she is due for her period but denies any vaginal bleeding. She then had 2 more bowel movements later today that was completely normal. She has a history of hemorrhoids after giving birth but is not currently on any medicines. She is not on any blood thinners and denies any fevers or vomiting.   The history is provided by the patient.    Past Medical History:  Diagnosis Date  . Asthma, chronic 06/15/2013  . Tourette syndrome   . Tourette's disorder    Dr. Zola Button    Patient Active Problem List   Diagnosis Date Noted  . Nausea 02/24/2017  . Leg swelling 02/24/2017  . Asthma, chronic 06/15/2013  . TOURETTE'S DISORDER ([facility]) 03/07/2008    Past Surgical History:  Procedure Laterality Date  . MYRINGOTOMY     bilateral  . Pallet repair  1990    OB History    Gravida Para Term Preterm AB Living   SAB TAB Ectopic Multiple Live Births         0 2       Home Medications    Prior to Admission medications   Medication Sig Start Date End Date Taking? Authorizing Provider  ARIPiprazole (ABILIFY) 2 MG tablet Take 2 mg by mouth daily.     Historical Provider, MD  cloNIDine (CATAPRES) 0.1 MG tablet Take 0.05 mg by mouth daily.     Historical Provider, MD  hydrochlorothiazide (MICROZIDE) 12.5 MG capsule Take 1 capsule (12.5 mg total) by mouth daily. 01/25/17   Hannah Beat, MD    Family History Family History  Problem Relation Age of Onset  . Healthy Father   . Healthy Mother   . Diabetes Maternal Grandfather   .  Stroke Paternal Grandmother   . Heart attack Paternal Grandmother   . Cancer Neg Hx     Social History Social History  Substance Use Topics  . Smoking status: Never Smoker  . Smokeless tobacco: Never Used  . Alcohol use No     Comment: Occasional     Allergies   Patient has no known allergies.   Review of Systems Review of Systems  Gastrointestinal: Positive for blood in stool.  All other systems reviewed and are negative.    Physical Exam Updated Vital Signs BP (!) 142/83 (BP Location: Left Arm)   Pulse 99   Temp 98.3 F (36.8 C) (Oral)   Resp 18   Wt 220 lb (99.8 kg)   LMP 02/01/2017   SpO2 100%   BMI 39.60 kg/m   Physical Exam  Constitutional: She appears well-developed and well-nourished.  HENT:  Head: Normocephalic.  Eyes: Pupils are equal, round, and reactive to light.  Neck: Normal range of motion.  Cardiovascular: Normal rate.   Pulmonary/Chest: Effort normal.  Abdominal: Soft.  Genitourinary:  Genitourinary Comments: Rectal- small external hemorrhoids, no active bleeding, no anal fissures. No peri rectal abscess   Neurological: She is alert.  Skin: Skin  is warm.  Psychiatric: She has a normal mood and affect.  Nursing note and vitals reviewed.    ED Treatments / Results  Labs (all labs ordered are listed, but only abnormal results are displayed) Labs Reviewed - No data to display  EKG  EKG Interpretation None       Radiology No results found.  Procedures Procedures (including critical care time)  Medications Ordered in ED Medications - No data to display   Initial Impression / Assessment and Plan / ED Course  I have reviewed the triage vital signs and the nursing notes.  Pertinent labs & imaging results that were available during my care of the patient were reviewed by me and considered in my medical decision making (see chart for details).     Caroline Fletcher is a 31 y.o. female here with blood in stool. Has small external  hemorrhoid with no active bleeding. Recommend colace, anusol suppository.   Final Clinical Impressions(s) / ED Diagnoses   Final diagnoses:  None    New Prescriptions New Prescriptions   No medications on file     Charlynne Pander, MD 03/03/17 1241

## 2017-03-04 ENCOUNTER — Encounter: Payer: Self-pay | Admitting: *Deleted

## 2017-03-20 ENCOUNTER — Emergency Department
Admission: EM | Admit: 2017-03-20 | Discharge: 2017-03-21 | Disposition: A | Discharge status: Home / Self Care | Payer: Medicaid Other | Attending: Emergency Medicine | Admitting: Emergency Medicine

## 2017-03-20 ENCOUNTER — Encounter: Payer: Self-pay | Admitting: Emergency Medicine

## 2017-03-20 DIAGNOSIS — Z79899 Other long term (current) drug therapy: Secondary | ICD-10-CM | POA: Insufficient documentation

## 2017-03-20 DIAGNOSIS — L501 Idiopathic urticaria: Secondary | ICD-10-CM | POA: Insufficient documentation

## 2017-03-20 DIAGNOSIS — T781XXA Other adverse food reactions, not elsewhere classified, initial encounter: Secondary | ICD-10-CM | POA: Insufficient documentation

## 2017-03-20 DIAGNOSIS — L272 Dermatitis due to ingested food: Secondary | ICD-10-CM

## 2017-03-20 DIAGNOSIS — J45909 Unspecified asthma, uncomplicated: Secondary | ICD-10-CM | POA: Insufficient documentation

## 2017-03-20 MED ORDER — CYCLOBENZAPRINE HCL 10 MG PO TABS: 10.0000 mg | ORAL_TABLET | Freq: Once | ORAL | Status: DC

## 2017-03-20 MED ORDER — DIPHENHYDRAMINE HCL 50 MG/ML IJ SOLN
50.0000 mg | Freq: Once | INTRAMUSCULAR | Status: AC
  Administered 2017-03-20: 50 mg via INTRAVENOUS
  Filled 2017-03-20: qty 1

## 2017-03-20 MED ORDER — FAMOTIDINE IN NACL 20-0.9 MG/50ML-% IV SOLN
20.0000 mg | Freq: Once | INTRAVENOUS | Status: AC
  Administered 2017-03-20: 20 mg via INTRAVENOUS
  Filled 2017-03-20: qty 50

## 2017-03-20 MED ORDER — DEXAMETHASONE SODIUM PHOSPHATE 10 MG/ML IJ SOLN
10.0000 mg | Freq: Once | INTRAMUSCULAR | Status: AC
  Administered 2017-03-20: 10 mg via INTRAVENOUS
  Filled 2017-03-20: qty 1

## 2017-03-20 MED ORDER — SODIUM CHLORIDE 0.9 % IV BOLUS (SEPSIS)
1000.0000 mL | Freq: Once | INTRAVENOUS | Status: AC
  Administered 2017-03-20: 1000 mL via INTRAVENOUS

## 2017-03-20 NOTE — ED Triage Notes (Signed)
Pt presents ambulatory to ED with c/o hives. Pt states that she ate some chocolate truffles and a [employer] brand chicken biscuit cracker before 1000 this morning and got some hives. Pt reports taking Benadryl around the same time and now continues to have hives. Pt denies SOB and is in NAD at this time.

## 2017-03-20 NOTE — ED Notes (Signed)
Patient has hives/rash bilateral arms, front & back of trunk, behind both knees. Warm to touch and itchy. Patient is on abilify (not taken today) so only took  of benadryl this morning. Patient denies any known allergies.

## 2017-03-21 MED ORDER — CYPROHEPTADINE HCL 4 MG PO TABS: 4.0000 mg | ORAL_TABLET | Freq: Three times a day (TID) | ORAL | 0 refills | Status: DC | PRN

## 2017-03-21 MED ORDER — PREDNISONE 10 MG (21) PO TBPK: ORAL_TABLET | ORAL | 0 refills | Status: DC

## 2017-03-21 MED ORDER — RANITIDINE HCL 150 MG PO TABS: 150.0000 mg | ORAL_TABLET | Freq: Two times a day (BID) | ORAL | 0 refills | Status: DC

## 2017-03-21 NOTE — Discharge Instructions (Signed)
You have been treated for a hives reaction. While most cases of hives are unknown, you may have had a reaction to a food or a food ingredient. Take the prescription steroid and antihistamines as directed. Avoid hot showers or excessive heat until all symptoms have resolved. Follow-up with your provider or return to the ED as needed.

## 2017-03-21 NOTE — ED Provider Notes (Signed)
[facility] Emergency Department Provider Note ____________________________________________  Time seen: 2210  I have reviewed the triage vital signs and the nursing notes.  HISTORY  Chief Complaint  Urticaria  HPI Caroline Fletcher is a 31 y.o. female presents to the ED for evaluation of hives. She denies any previous food sensitivities orhives outbreaks. She reports onset was about 10 minutes after eating some store-brand chicken-flavored crackers & cheese. She denies any sore throat, cough, SOB, or swelling of the mouth/lip/tongue. She reports small scattered hives that have progressed to large, whelps over her entire body. She dosed 25 mg of Benadryl this morning, but now reports due to worsening and progressive hives.   Past Medical History:  Diagnosis Date  . Asthma, chronic 06/15/2013  . Tourette syndrome   . Tourette's disorder    Dr. Zola Button    Patient Active Problem List   Diagnosis Date Noted  . Nausea 02/24/2017  . Leg swelling 02/24/2017  . Asthma, chronic 06/15/2013  . TOURETTE'S DISORDER ([facility]) 03/07/2008    Past Surgical History:  Procedure Laterality Date  . MYRINGOTOMY     bilateral  . Pallet repair  1990    Prior to Admission medications   Medication Sig Start Date End Date Taking? Authorizing Provider  ARIPiprazole (ABILIFY) 2 MG tablet Take 2 mg by mouth daily.     Historical Provider, MD  cloNIDine (CATAPRES) 0.1 MG tablet Take 0.05 mg by mouth daily.     Historical Provider, MD  cyproheptadine (PERIACTIN) 4 MG tablet Take 1 tablet (4 mg total) by mouth 3 (three) times daily as needed for allergies. 03/21/17   Zo Loudon V Bacon Anu Stagner, PA-C  hydrochlorothiazide (MICROZIDE) 12.5 MG capsule Take 1 capsule (12.5 mg total) by mouth daily. 01/25/17   Hannah Beat, MD  hydrocortisone (ANUSOL-HC) 25 MG suppository Place 1 suppository (25 mg total) rectally 2 (two) times daily. For 7 days 03/03/17   Charlynne Pander, MD  predniSONE (STERAPRED  UNI-PAK 21 TAB) 10 MG (21) TBPK tablet 6-day taper as directed. 03/21/17   Lillith Mcneff V Bacon Felise Georgia, PA-C  ranitidine (ZANTAC) 150 MG tablet Take 1 tablet (150 mg total) by mouth 2 (two) times daily. 03/21/17   Charlesetta Ivory Fintan Grater, PA-C    Allergies Patient has no known allergies.  Family History  Problem Relation Age of Onset  . Healthy Father   . Healthy Mother   . Diabetes Maternal Grandfather   . Stroke Paternal Grandmother   . Heart attack Paternal Grandmother   . Cancer Neg Hx     Social History Social History  Substance Use Topics  . Smoking status: Never Smoker  . Smokeless tobacco: Never Used  . Alcohol use No     Comment: Occasional    Review of Systems  Constitutional: Negative for fever. Eyes: Negative for visual changes. ENT: Negative for sore throat. Negative for lip or tongue swelling.  Cardiovascular: Negative for chest pain. Respiratory: Negative for shortness of breath. Gastrointestinal: Negative for abdominal pain, vomiting and diarrhea. Genitourinary: Negative for dysuria. Musculoskeletal: Negative for back pain. Skin: Positive for rash. Neurological: Negative for headaches, focal weakness or numbness. ____________________________________________  PHYSICAL EXAM:  VITAL SIGNS: ED Triage Vitals  Enc Vitals Group     BP 03/20/17 2142 (!) 146/96     Pulse Rate 03/20/17 2142 (!) 101     Resp 03/20/17 2142 18     Temp 03/20/17 2142 98.1 F (36.7 C)     Temp Source 03/20/17 2142 Oral  SpO2 03/20/17 2142 99 %     Weight 03/20/17 2138 215 lb (97.5 kg)     Height 03/20/17 2138  (1.575 m)     Head Circumference --      Peak Flow --      Pain Score 03/20/17 2137 0     Pain Loc --      Pain Edu? --      Excl. in GC? --     Constitutional: Alert and oriented. Well appearing and in no distress. Head: Normocephalic and atraumatic. Eyes: Conjunctivae are normal. PERRL. Normal extraocular movements Ears: Canals clear. TMs intact  bilaterally. Nose: No congestion/rhinorrhea/epistaxis. Mouth/Throat: Mucous membranes are moist. Uvula is midline and tonsils are flat. Neck: Supple. No thyromegaly. Hematological/Lymphatic/Immunological: No cervical lymphadenopathy. Cardiovascular: Normal rate, regular rhythm. Normal distal pulses. Respiratory: Normal respiratory effort. No wheezes/rales/rhonchi. Musculoskeletal: Nontender with normal range of motion in all extremities.  Neurologic:  Normal gait without ataxia. Normal speech and language. No gross focal neurologic deficits are appreciated. Skin:  Skin is warm, dry and intact. Patient with large, erythematous, macular whelps noted over the extremities, trunk, and face. ____________________________________________  PROCEDURES  Benadryl 50 mg IVP Famotidine 40 mg IVPB Decadron 10 mg IVP NS 500 mg bolus ____________________________________________  INITIAL IMPRESSION / ASSESSMENT AND PLAN / ED COURSE  Patient reports resolved itching and near complete resolution of her hives after IV medication administration. She will be discharged with prescriptions for ranitidine, Periactin, and a steroid taper pack to dose as directed. She'll follow with the primary care provider or return to the ED for acutely worsening symptoms as discussed. Patient's symptoms likely represent idiopathic hives although a food sensitivity cannot be ruled out given her presentation. ____________________________________________  FINAL CLINICAL IMPRESSION(S) / ED DIAGNOSES  Final diagnoses:  Idiopathic urticaria  Food allergic skin reaction      Lissa Hoard, PA-C 03/21/17 0013    Sharyn Creamer, MD 03/27/17 [phone]

## 2017-03-21 NOTE — ED Notes (Signed)
Pt. Going home with family. 

## 2017-06-07 ENCOUNTER — Encounter: Payer: Self-pay | Admitting: Advanced Practice Midwife

## 2017-06-09 ENCOUNTER — Other Ambulatory Visit: Payer: Self-pay

## 2017-06-09 MED ORDER — HYDROCHLOROTHIAZIDE 12.5 MG PO CAPS: 12.5000 mg | ORAL_CAPSULE | Freq: Every day | ORAL | 3 refills | Status: DC

## 2017-06-09 NOTE — Telephone Encounter (Signed)
Pt left v/m; pt thinks HCTZ has been recalled and wants to know if that is correct or not.(I called Morrie SheldonAshley at [facility] [address] and HCTZ is not on recall). Pt request refill of HCTZ if she can remain on med. Pt seen 01/21/17; on lab result note 01/25/17 HCTZ 12.5 mg taking one daily was started # 30 x 3 refills. Pt does not have future appt. Do you want pt to remain on HCTZ and does she need f/u appt?

## 2017-06-09 NOTE — Telephone Encounter (Signed)
Caroline SheldonAshley notified by telephone that HCTZ has not been recalled and to continue taking.  Refills sent into [facility] [address] in [place].

## 2017-06-09 NOTE — Telephone Encounter (Signed)
Left message for Liseth to return my call.

## 2017-07-22 DIAGNOSIS — Z3202 Encounter for pregnancy test, result negative: Secondary | ICD-10-CM | POA: Diagnosis not present

## 2017-07-22 DIAGNOSIS — Z01419 Encounter for gynecological examination (general) (routine) without abnormal findings: Secondary | ICD-10-CM | POA: Diagnosis not present

## 2017-07-22 DIAGNOSIS — Z6839 Body mass index (BMI) 39.0-39.9, adult: Secondary | ICD-10-CM | POA: Diagnosis not present

## 2017-07-22 DIAGNOSIS — Z3201 Encounter for pregnancy test, result positive: Secondary | ICD-10-CM | POA: Diagnosis not present

## 2017-07-22 DIAGNOSIS — E282 Polycystic ovarian syndrome: Secondary | ICD-10-CM | POA: Diagnosis not present

## 2017-08-02 ENCOUNTER — Ambulatory Visit (INDEPENDENT_AMBULATORY_CARE_PROVIDER_SITE_OTHER): Payer: BLUE CROSS/BLUE SHIELD | Admitting: Certified Nurse Midwife

## 2017-08-02 ENCOUNTER — Other Ambulatory Visit: Payer: Self-pay | Admitting: Certified Nurse Midwife

## 2017-08-02 ENCOUNTER — Other Ambulatory Visit (INDEPENDENT_AMBULATORY_CARE_PROVIDER_SITE_OTHER): Payer: BLUE CROSS/BLUE SHIELD

## 2017-08-02 ENCOUNTER — Encounter: Payer: Self-pay | Admitting: Certified Nurse Midwife

## 2017-08-02 VITALS — BP 160/90 | HR 96 | Ht 62.0 in | Wt 218.0 lb

## 2017-08-02 DIAGNOSIS — O26859 Spotting complicating pregnancy, unspecified trimester: Secondary | ICD-10-CM

## 2017-08-02 DIAGNOSIS — I10 Essential (primary) hypertension: Secondary | ICD-10-CM

## 2017-08-02 DIAGNOSIS — O039 Complete or unspecified spontaneous abortion without complication: Secondary | ICD-10-CM

## 2017-08-02 NOTE — Progress Notes (Signed)
Obstetrics & Gynecology Office Visit   Chief Complaint:  Chief Complaint  Patient presents with  . Threatened Miscarriage    "The doctor in [place] told me I miscarried."    History of Present Illness: 31 year old G3 P53 with LMP 05/27/2017 presents with above concerns. Had her Mirena IUD removed 12/21/2016 for metrorrhagia after which she had regular menses. HAs been using condoms for birth control. Had a faint positive pregnancy test in early August. Was seen in [place] to start prenatal care 1-2 weeks ago. Had an ultrasound at that time that showed a empty gestational sac and she was told she had a miscarriage. Had a day of spotting 2 Sept and still has some pregnancy symptoms including feeling bloated and having breast tenderness. She was given a RX for progesterone only pills since she would like to delay pregnancy for 2-3 years. Has not started taking them yet. Her blood pressures have been elevated this year and she has had some increased edema. She was started on HCTZ by Dr Patsy Lager in March. She also takes Clonidine 0.05 mgm daily and Abilify for her Tourette's syndrome.    Review of Systems:  Review of Systems  Constitutional: Negative for chills and fever.  Respiratory: Negative for cough.   Cardiovascular: Negative for chest pain and palpitations.  Gastrointestinal: Negative for abdominal pain, nausea and vomiting.       Feels bloated  Genitourinary: Negative for dysuria.       Positive for irregular bleeding  Skin: Negative for rash.  Endo/Heme/Allergies:       Positive for breast tenderness     Past Medical History:  Past Medical History:  Diagnosis Date  . Asthma, chronic 06/15/2013  . Cleft palate   . History of abnormal cervical Pap smear 2014   LGSIL with positive HRHPV  . 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 Hypertension   . Tourette's disorder    Dr. Zola Button    Past Surgical History:  Past Surgical History:    Procedure Laterality Date  . COLPOSCOPY  2009  . MYRINGOTOMY     bilateral  . Pallet repair  1990    Gynecologic History: Patient's last menstrual period was 05/27/2017 (exact date).  Obstetric History: [id number]  Family History:  Family History  Problem Relation Age of Onset  . Healthy Father   . Healthy Mother   . Diabetes Maternal Grandfather   . Stroke Paternal Grandmother   . Heart attack Paternal Grandmother   . Cancer Neg Hx     Social History:  Social History   Social History  . Marital status: Married    Spouse name: N/A  . Number of children: N/A  . Years of education: N/A   Occupational History  . Not on file.   Social History Main Topics  . Smoking status: Never Smoker  . Smokeless tobacco: Never Used  . Alcohol use No     Comment: Occasional  . Drug use: No  . Sexual activity: No   Other Topics Concern  . Not on file   Social History Narrative  . No narrative on file    Allergies:  No Known Allergies  Medications: Prior to Admission medications   Medication Sig Start Date End Date Taking? Authorizing Provider  ARIPiprazole (ABILIFY) 2 MG tablet Take 2 mg by mouth daily.     [provider]  cloNIDine (CATAPRES) 0.1 MG tablet Take 0.05 mg by mouth  daily.     [provider]  hydrochlorothiazide (MICROZIDE) 12.5 MG capsule Take 1 capsule (12.5 mg total) by mouth daily. 06/09/17   Hannah Beatopland, [place], MD    Physical Exam  Vitals ; BP (!) 160/90   Pulse 96   Ht 5\' 2"  (1.575 m)   Wt 218 lb (98.9 kg)   LMP 05/27/2017 (Exact Date)   Breastfeeding? No   BMI 39.87 kg/m  Physical Exam  Constitutional: She is oriented to person, place, and time. She appears well-developed and well-nourished. No distress.  Respiratory: Effort normal.  Neurological: She is alert and oriented to person, place, and time.  Skin: Skin is warm and dry.  Psychiatric: She has a normal mood and affect. Her behavior is normal. Thought content normal.    TVUS today was normal with no gestational sac seen. Ovaries WNL  Assessment: 31 y.o. G2P2002 probable early SAB Elevated blood pressure reading with hypertension  Plan: Can start progesterone only pills this week. Reviewed how to take and when to use barrier contraception Can increase Clonidine back to .1 mgm FOllow up with Dr Patsy Lageropland for hypertension Take multivitamin daily or folic acid 400 mcg daily [facility]  Farrel Connersolleen Keshawn Fiorito, CNM

## 2017-08-05 ENCOUNTER — Encounter: Payer: Self-pay | Admitting: Maternal Newborn

## 2017-08-08 ENCOUNTER — Encounter: Payer: Self-pay | Admitting: Certified Nurse Midwife

## 2017-08-19 ENCOUNTER — Other Ambulatory Visit: Payer: Self-pay | Admitting: Obstetrics and Gynecology

## 2017-08-19 ENCOUNTER — Other Ambulatory Visit: Payer: BLUE CROSS/BLUE SHIELD

## 2017-08-19 DIAGNOSIS — N926 Irregular menstruation, unspecified: Secondary | ICD-10-CM | POA: Diagnosis not present

## 2017-08-20 ENCOUNTER — Telehealth: Payer: Self-pay

## 2017-08-20 LAB — BETA HCG QUANT (REF LAB)

## 2017-08-20 NOTE — Telephone Encounter (Signed)
-----   Message from Conard Novak, MD sent at 08/20/2017  8:21 AM EDT ----- Please let patient know that the blood level of the pregnancy hormone was negative.  Thanks!

## 2017-08-20 NOTE — Telephone Encounter (Signed)
Pt aware of results. Pt states she still having some symptoms of pregnancy. Wants to be seen to make sure everything is okay. Please help pt get appt scheduled. She is aware you will call her, thank you!

## 2017-08-23 ENCOUNTER — Encounter: Payer: Self-pay | Admitting: Obstetrics and Gynecology

## 2017-08-23 ENCOUNTER — Ambulatory Visit (INDEPENDENT_AMBULATORY_CARE_PROVIDER_SITE_OTHER): Payer: BLUE CROSS/BLUE SHIELD | Admitting: Obstetrics and Gynecology

## 2017-08-23 VITALS — BP 118/78 | Wt 220.0 lb

## 2017-08-23 DIAGNOSIS — N926 Irregular menstruation, unspecified: Secondary | ICD-10-CM

## 2017-08-23 NOTE — Progress Notes (Signed)
Obstetrics & Gynecology Office Visit   Chief Complaint  Patient presents with  . Amenorrhea    History of Present Illness: 31 y.o. Z6X0960 female presents for absent menses.  She states that her last menses was at the beginning of July. She had a positive pregnancy test at the end of July and while in [place] and had a pelvic ultrasound in mid August that showed a gestational sac. She had no follow-up from this and has had no bleeding since that time. Her recent quantitative hCG was 0. She states she is concerned that she is feeling flutters in her pelvis. She denies fevers and chills.  Past Medical History:  Diagnosis Date  . Asthma, chronic 06/15/2013  . Cleft palate   . History of abnormal cervical Pap smear 2014   LGSIL with positive HRHPV  . 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 Hypertension   . Tourette's disorder    Dr. Zola Button    Past Surgical History:  Procedure Laterality Date  . COLPOSCOPY  2009  . MYRINGOTOMY     bilateral  . Pallet repair  1990    Gynecologic History: No LMP recorded.  Obstetric History: [id number]  Family History  Problem Relation Age of Onset  . Healthy Father   . Healthy Mother   . Stroke Paternal Grandmother   . Heart attack Paternal Grandmother   . Diabetes Maternal Grandmother   . Cancer Neg Hx     Social History   Social History  . Marital status: Married    Spouse name: N/A  . Number of children: 2  . Years of education: N/A   Occupational History  . Not on file.   Social History Main Topics  . Smoking status: Never Smoker  . Smokeless tobacco: Never Used  . Alcohol use No     Comment: Occasional  . Drug use: No  . Sexual activity: No   Other Topics Concern  . Not on file   Social History Narrative  . No narrative on file    No Known Allergies  Prior to Admission medications   Medication Sig Start Date End Date Taking? Authorizing Provider  ARIPiprazole (ABILIFY) 2  MG tablet Take 2 mg by mouth daily.    Yes [provider]  cloNIDine (CATAPRES) 0.1 MG tablet Take 0.05 mg by mouth daily.    Yes [provider]  hydrochlorothiazide (MICROZIDE) 12.5 MG capsule Take 1 capsule (12.5 mg total) by mouth daily. Patient not taking: Reported on 08/23/2017 06/09/17   Hannah Beat, MD    Review of Systems  Constitutional: Negative.   HENT: Negative.   Eyes: Negative.   Respiratory: Negative.   Cardiovascular: Negative.   Gastrointestinal: Negative.   Genitourinary: Negative.   Musculoskeletal: Negative.   Skin: Negative.   Neurological: Negative.   Psychiatric/Behavioral: Negative.      Physical Exam BP 118/78   Wt 220 lb (99.8 kg)   BMI 40.24 kg/m  No LMP recorded. Physical Exam  Constitutional: She is oriented to person, place, and time. She appears well-developed and well-nourished. No distress.  HENT:  Head: Normocephalic and atraumatic.  Eyes: EOM are normal. No scleral icterus.  Neck: Normal range of motion. Neck supple. No thyromegaly present.  Cardiovascular: Normal rate, regular rhythm and normal heart sounds.   Pulmonary/Chest: Effort normal and breath sounds normal. No respiratory distress. She has no wheezes. She has no rales.  Abdominal: Soft. Bowel sounds are normal.  She exhibits no distension and no mass. There is no tenderness. There is no rebound and no guarding.  Lymphadenopathy:    She has no cervical adenopathy.  Neurological: She is alert and oriented to person, place, and time. No cranial nerve deficit.  Skin: Skin is warm and dry. No rash noted.  Psychiatric: She has a normal mood and affect. Her behavior is normal. Judgment normal.   Assessment: 31 y.o. Z6X0960 female with a missed menses.    Plan: Problem List Items Addressed This Visit    None    Visit Diagnoses    Missed period    -  Primary   Relevant Orders   US PELVIS TRANSVANGINAL NON-OB (TV ONLY)    She has a history of oligomenorrhea.  So, my first instinct is to treat her for that. However, if she has some retained products of conception from a possible pregnancy (per her report only) in early-mid August, I would prefer to know about that and possibly actively manage that issue.  If ultrasound is negative, will effect menses with provera.  Otherwise, will treat based on findings of ultrasound in discussion with the patient.  15 minutes spent in face to face discussion with > 50% spent in counseling and management of her missed menses.   Thomasene Mohair, MD 08/23/2017 4:20 PM

## 2017-08-23 NOTE — Telephone Encounter (Signed)
Called and left voicemail for patient to call back  to schedule follow up for amenorrhea.

## 2017-09-08 ENCOUNTER — Ambulatory Visit (INDEPENDENT_AMBULATORY_CARE_PROVIDER_SITE_OTHER): Payer: BLUE CROSS/BLUE SHIELD | Admitting: Obstetrics and Gynecology

## 2017-09-08 ENCOUNTER — Ambulatory Visit (INDEPENDENT_AMBULATORY_CARE_PROVIDER_SITE_OTHER): Payer: BLUE CROSS/BLUE SHIELD

## 2017-09-08 VITALS — BP 118/76 | Ht 61.0 in | Wt 218.0 lb

## 2017-09-08 DIAGNOSIS — N926 Irregular menstruation, unspecified: Secondary | ICD-10-CM | POA: Diagnosis not present

## 2017-09-08 DIAGNOSIS — N914 Secondary oligomenorrhea: Secondary | ICD-10-CM

## 2017-09-08 DIAGNOSIS — N915 Oligomenorrhea, unspecified: Secondary | ICD-10-CM | POA: Insufficient documentation

## 2017-09-08 MED ORDER — NORGESTIMATE-ETH ESTRADIOL 0.25-35 MG-MCG PO TABS: 1.0000 | ORAL_TABLET | Freq: Every day | ORAL | 1 refills | Status: DC

## 2017-09-08 NOTE — Progress Notes (Signed)
Gynecology Ultrasound Follow Up   Chief Complaint  Patient presents with  . Follow-up  oligomenorrhea  History of Present Illness: Patient is a 31 y.o. female who presents today for ultrasound evaluation of pregnancy symptoms along with oligomenorrhea.  Ultrasound demonstrates the following findings Adnexa: no masses seen (no features suggestive of PCOS) Uterus: anteverted with endometrial stripe  9.5 mm Additional: otherwise normal ultrasound.  Past Medical History:  Diagnosis Date  . Asthma, chronic 06/15/2013  . Cleft palate   . History of abnormal cervical Pap smear 2014   LGSIL with positive HRHPV  . 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 Hypertension   . Tourette's disorder    Dr. Tonye Royalty    Past Surgical History:  Procedure Laterality Date  . COLPOSCOPY  2009  . MYRINGOTOMY     bilateral  . Pallet repair  1990    Family History  Problem Relation Age of Onset  . Healthy Father   . Healthy Mother   . Stroke Paternal Grandmother   . Heart attack Paternal Grandmother   . Diabetes Maternal Grandmother   . Cancer Neg Hx     Social History   Social History  . Marital status: Married    Spouse name: N/A  . Number of children: 2  . Years of education: N/A   Occupational History  . Not on file.   Social History Main Topics  . Smoking status: Never Smoker  . Smokeless tobacco: Never Used  . Alcohol use No     Comment: Occasional  . Drug use: No  . Sexual activity: No   Other Topics Concern  . Not on file   Social History Narrative  . No narrative on file    No Known Allergies  Prior to Admission medications   Medication Sig Start Date End Date Taking? Authorizing Provider  ARIPiprazole (ABILIFY) 2 MG tablet Take 2 mg by mouth daily.     [provider]  cloNIDine (CATAPRES) 0.1 MG tablet Take 0.05 mg by mouth daily.     [provider]  hydrochlorothiazide (MICROZIDE) 12.5 MG capsule  Take 1 capsule (12.5 mg total) by mouth daily. Patient not taking: Reported on 08/23/2017 06/09/17   Owens Loffler, MD   Physical Exam BP 118/76   Ht _0  (1.549 m)   Wt 218 lb (98.9 kg)   BMI 41.19 kg/m    General: NAD HEENT: normocephalic, anicteric Pulmonary: No increased work of breathing Extremities: no edema, erythema, or tenderness Neurologic: Grossly intact, normal gait Psychiatric: mood appropriate, affect full  Assessment: 31 y.o. [id number] with oligomenorrhea, chronic.    Plan: Problem List Items Addressed This Visit    Oligomenorrhea - Primary (Chronic)   Relevant Medications   norgestimate-ethinyl estradiol (SPRINTEC 28) 0.25-35 MG-MCG tablet     Discussed that patient would like some form of contraception and some hormonal medication to effect a menses with some regularity. She has had a workup in the past and has never met criteria for PCOS, but no other diagnosis is obvious for her oligomenorrhea. We discussed whether she has been diagnosed with hypertension in the past. She adamantly states that she has not been diagnosed with high blood pressure.  She states she takes clonidine for Tourette's syndrome and she was briefly on HCTZ for some temporary ankle swelling she had. She is normotensive today.  Will give her a brief trial on Sprintec. Will her follow up in 1-2 months  for a blood pressure check on the medication.    Prentice Docker, MD 09/08/2017 12:53 PM

## 2017-09-21 NOTE — Progress Notes (Signed)
This encounter was created in error - please disregard.  This encounter was created in error - please disregard.

## 2017-09-27 DIAGNOSIS — J069 Acute upper respiratory infection, unspecified: Secondary | ICD-10-CM | POA: Diagnosis not present

## 2017-09-30 ENCOUNTER — Ambulatory Visit (INDEPENDENT_AMBULATORY_CARE_PROVIDER_SITE_OTHER): Payer: BLUE CROSS/BLUE SHIELD | Admitting: Obstetrics and Gynecology

## 2017-09-30 ENCOUNTER — Encounter: Payer: Self-pay | Admitting: Obstetrics and Gynecology

## 2017-09-30 VITALS — BP 118/74 | Ht 62.0 in | Wt 218.0 lb

## 2017-09-30 DIAGNOSIS — N926 Irregular menstruation, unspecified: Secondary | ICD-10-CM

## 2017-09-30 NOTE — Progress Notes (Signed)
Patient had a positive home pregnancy test.  Negative here. Will order serum hcg level today.  Reiterated prenatal care issues such as taking PNVs, not smoking, eating healthfully.   Thomasene MohairStephen Jackson, MD 09/30/2017 2:42 PM

## 2017-10-01 ENCOUNTER — Telehealth: Payer: Self-pay

## 2017-10-01 LAB — BETA HCG QUANT (REF LAB): hCG Quant: <1 m[IU]/mL

## 2017-10-01 NOTE — Telephone Encounter (Signed)
Pt calling for test results.  [phone]

## 2017-10-01 NOTE — Telephone Encounter (Signed)
Patient now aware of results.

## 2017-10-28 ENCOUNTER — Telehealth: Payer: Self-pay | Admitting: Family Medicine

## 2017-10-28 NOTE — Telephone Encounter (Signed)
Copied from CRM [id number]. Topic: Quick Communication - See Telephone Encounter >> Oct 28, 2017 10:25 AM Arlyss Gandyichardson, Chloe Flis N, NT wrote: CRM for notification. See Telephone encounter for: Pt needing refill of hydrochlorothiazide. Uses [facility] [address].   10/28/17.

## 2017-11-01 ENCOUNTER — Other Ambulatory Visit: Payer: Self-pay | Admitting: Family Medicine

## 2017-11-02 MED ORDER — HYDROCHLOROTHIAZIDE 12.5 MG PO CAPS: 12.5000 mg | ORAL_CAPSULE | Freq: Every day | ORAL | 2 refills | Status: DC

## 2017-11-02 NOTE — Telephone Encounter (Signed)
Attempted to call the pt to make aware prescription was filled. Voicemail full unable to leave message.

## 2017-11-03 ENCOUNTER — Other Ambulatory Visit: Payer: Self-pay | Admitting: Family Medicine

## 2017-11-03 MED ORDER — HYDROCHLOROTHIAZIDE 12.5 MG PO CAPS: ORAL_CAPSULE | ORAL | 0 refills | Status: DC

## 2017-11-03 NOTE — Addendum Note (Signed)
Addended by: Damita LackLORING, DONNA S on: 11/03/2017 04:57 PM   Modules accepted: Orders

## 2017-11-03 NOTE — Telephone Encounter (Signed)
Prescription for Hydrochlorothiazide shows it was printed at the office.Was the medication faxed to the pharmacy? [facility] stating rx was denied.

## 2017-11-03 NOTE — Telephone Encounter (Signed)
Pt sates the pharmacy told her it could not be filled because it was a new Rx and it was denied. Pt request if you can call the pharmacy and see if it can be filled or what is wrong  [facility] [id number] - [place] - [address] [facility] [address] [phone] (Phone) [phone] (Fax)

## 2017-11-03 NOTE — Telephone Encounter (Signed)
[facility] [address] Morrie SheldonAshley notified by telephone.

## 2017-12-15 ENCOUNTER — Ambulatory Visit (INDEPENDENT_AMBULATORY_CARE_PROVIDER_SITE_OTHER): Payer: BLUE CROSS/BLUE SHIELD | Admitting: Obstetrics and Gynecology

## 2017-12-15 ENCOUNTER — Encounter: Payer: Self-pay | Admitting: Obstetrics and Gynecology

## 2017-12-15 VITALS — BP 118/74 | Ht 63.0 in | Wt 220.0 lb

## 2017-12-15 DIAGNOSIS — N914 Secondary oligomenorrhea: Secondary | ICD-10-CM | POA: Diagnosis not present

## 2017-12-15 DIAGNOSIS — N760 Acute vaginitis: Secondary | ICD-10-CM | POA: Diagnosis not present

## 2017-12-15 LAB — POCT WET PREP WITH KOH
CLUE CELLS WET PREP PER HPF POC: NEGATIVE
KOH Prep POC: NEGATIVE
PH, VAGINAL: 5
TRICHOMONAS UA: NEGATIVE

## 2017-12-15 MED ORDER — MEDROXYPROGESTERONE ACETATE 10 MG PO TABS: 10.0000 mg | ORAL_TABLET | Freq: Every day | ORAL | 11 refills | Status: DC

## 2017-12-15 NOTE — Progress Notes (Signed)
Obstetrics & Gynecology Office Visit   Chief Complaint  Patient presents with  . Vaginal Discharge   History of Present Illness: Vaginitis: Patient complains of an abnormal vaginal discharge for 1 week. Vaginal symptoms include none.Vulvar symptoms include none.STI Risk: Very low risk of STD exposureDischarge described as: yellow, thick, mucoid and possible brown tinge.Other associated symptoms: none.Menstrual pattern: She had been bleeding infrequently. Contraception: none. She has a history of oligomenorrhea.  She was started on combined OCPs at her last visit.  But, she has not taken these so far.  Past Medical History:  Diagnosis Date  . Asthma, chronic 06/15/2013  . Cleft palate   . History of abnormal cervical Pap smear 2014   LGSIL with positive HRHPV  . 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 Hypertension   . Tourette's disorder    Dr. Zola Button    Past Surgical History:  Procedure Laterality Date  . COLPOSCOPY  2009  . MYRINGOTOMY     bilateral  . Pallet repair  1990   Gynecologic History: Patient's last menstrual period was 11/20/2017.  Obstetric History: [id number]  Family History  Problem Relation Age of Onset  . Healthy Father   . Healthy Mother   . Stroke Paternal Grandmother   . Heart attack Paternal Grandmother   . Diabetes Maternal Grandmother   . Cancer Neg Hx     Social History   Socioeconomic History  . Marital status: Married    Spouse name: Not on file  . Number of children: 2  . Years of education: Not on file  . Highest education level: Not on file  Social Needs  . Financial resource strain: Not on file  . Food insecurity - worry: Not on file  . Food insecurity - inability: Not on file  . Transportation needs - medical: Not on file  . Transportation needs - non-medical: Not on file  Occupational History  . Not on file  Tobacco Use  . Smoking status: Never Smoker  . Smokeless tobacco: Never Used    Substance and Sexual Activity  . Alcohol use: No    Comment: Occasional  . Drug use: No  . Sexual activity: No  Other Topics Concern  . Not on file  Social History Narrative  . Not on file   Allergies: No Known Allergies  Prior to Admission medications   Medication Sig Start Date End Date Taking? Authorizing Provider  ARIPiprazole (ABILIFY) 2 MG tablet Take 2 mg by mouth daily.     [provider]  cloNIDine (CATAPRES) 0.1 MG tablet Take 0.05 mg by mouth daily.     [provider]  hydrochlorothiazide (MICROZIDE) 12.5 MG capsule TAKE 1 CAPSULE(12.5 MG) BY MOUTH DAILY 11/03/17   Copland, Karleen Hampshire, MD    Review of Systems  Constitutional: Negative.   HENT: Negative.   Eyes: Negative.   Respiratory: Negative.   Cardiovascular: Negative.   Gastrointestinal: Negative.   Genitourinary: Negative.        See HPI  Musculoskeletal: Negative.   Skin: Negative.   Neurological: Negative.   Psychiatric/Behavioral: Negative.      Physical Exam BP 118/74   Ht 5\' 3"  (1.6 m)   Wt 220 lb (99.8 kg)   LMP 11/20/2017   BMI 38.97 kg/m  Patient's last menstrual period was 11/20/2017. Physical Exam  Constitutional: She is oriented to person, place, and time. She appears well-developed and well-nourished. No distress.  Genitourinary: Vagina normal and uterus  normal. Pelvic exam was performed with patient supine. There is no rash, tenderness, lesion or injury on the right labia. There is no rash, tenderness, lesion or injury on the left labia. Vagina exhibits no lesion. No erythema, tenderness or bleeding in the vagina. No signs of injury around the vagina. No vaginal discharge found. Right adnexum does not display mass, does not display tenderness and does not display fullness. Left adnexum does not display mass, does not display tenderness and does not display fullness. Cervix does not exhibit motion tenderness, lesion, discharge or polyp.   Uterus is mobile. Uterus is not  enlarged, tender, exhibiting a mass or irregular (is regular).  Genitourinary Comments: Bimanual exam limited by body habitus  Eyes: EOM are normal. No scleral icterus.  Neck: Normal range of motion. Neck supple.  Cardiovascular: Regular rhythm.  Abdominal: Soft. Bowel sounds are normal. She exhibits no distension and no mass. There is no tenderness. There is no rebound and no guarding.  Musculoskeletal: Normal range of motion. She exhibits no edema.  Neurological: She is alert and oriented to person, place, and time. No cranial nerve deficit.  Skin: Skin is warm and dry. No erythema.  Psychiatric: She has a normal mood and affect. Her behavior is normal. Judgment normal.   Female chaperone present for pelvic and breast  portions of the physical exam  Wet Prep: PH: 5.0 Clue Cells: Negative Fungal elements: Negative Trichomonas: Negative   Assessment: 32 y.o. Z6X0960G3P2012 female here for  1. Acute vaginitis   2. Secondary oligomenorrhea      Plan: Problem List Items Addressed This Visit      Other   Oligomenorrhea (Chronic)   Relevant Medications   medroxyPROGESTERone (PROVERA) 10 MG tablet   Other Relevant Orders   Beta HCG, Quant    Other Visit Diagnoses    Acute vaginitis    -  Primary   Relevant Orders   POCT Wet Prep with KOH (Completed)     Reassured patient regarding vaginitis.  Symptoms might be mild bacterial vaginosis, but findings today not diagnostic. If symptoms worsen, then would recommend re-testing. She declined STD screening.    Oligomenorrhea: stop taking combined OCPs (though she has not started). Will start withdrawal bleeding with provera.  Discontinuing combined OCPs due to her history of hypertension.   Thomasene MohairStephen Lyrical Sowle, MD 12/15/2017 11:16 AM

## 2017-12-16 ENCOUNTER — Telehealth: Payer: Self-pay

## 2017-12-16 LAB — BETA HCG QUANT (REF LAB)

## 2017-12-16 NOTE — Telephone Encounter (Signed)
Pt is calling for test results from yesterday. CB# [phone]

## 2017-12-17 NOTE — Telephone Encounter (Signed)
Please let her know that her pregnancy test was negative. Thanks!

## 2017-12-17 NOTE — Telephone Encounter (Signed)
Pt aware, via voicemail 

## 2017-12-20 ENCOUNTER — Encounter: Payer: Self-pay | Admitting: Obstetrics and Gynecology

## 2017-12-22 ENCOUNTER — Other Ambulatory Visit: Payer: Self-pay | Admitting: Obstetrics and Gynecology

## 2017-12-22 DIAGNOSIS — N914 Secondary oligomenorrhea: Secondary | ICD-10-CM

## 2017-12-24 ENCOUNTER — Ambulatory Visit (INDEPENDENT_AMBULATORY_CARE_PROVIDER_SITE_OTHER): Payer: BLUE CROSS/BLUE SHIELD

## 2017-12-24 ENCOUNTER — Ambulatory Visit (INDEPENDENT_AMBULATORY_CARE_PROVIDER_SITE_OTHER): Payer: BLUE CROSS/BLUE SHIELD | Admitting: Obstetrics and Gynecology

## 2017-12-24 VITALS — BP 118/74 | Ht 63.0 in | Wt 220.0 lb

## 2017-12-24 DIAGNOSIS — N914 Secondary oligomenorrhea: Secondary | ICD-10-CM | POA: Diagnosis not present

## 2017-12-24 NOTE — Progress Notes (Signed)
Gynecology Ultrasound Follow Up   Chief Complaint  Patient presents with  . Follow-up  Oligomenorrhea with pregnancy symptoms  History of Present Illness: Patient is a 32 y.o. female who presents today for ultrasound evaluation of the above.  Ultrasound demonstrates the following findings Adnexa: no masses seen  Uterus: anteverted with endometrial stripe  10.5 mm Additional: 2.8 mm cystic area in the endometrial cavity Patient has a history of abnormal bleeding, usually scant. She has not been taking provera to affect a regular menses. She is holding off on taking the medication at this time because of concern she is pregnant. She had a negative quant hCG recently.  Past Medical History:  Diagnosis Date  . Asthma, chronic 06/15/2013  . Cleft palate   . History of abnormal cervical Pap smear 2014   LGSIL with positive HRHPV  . 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 Hypertension   . Tourette's disorder    Dr. Zola ButtonHaq    Past Surgical History:  Procedure Laterality Date  . COLPOSCOPY  2009  . MYRINGOTOMY     bilateral  . Pallet repair  1990    Family History  Problem Relation Age of Onset  . Healthy Father   . Healthy Mother   . Stroke Paternal Grandmother   . Heart attack Paternal Grandmother   . Diabetes Maternal Grandmother   . Cancer Neg Hx     Social History   Socioeconomic History  . Marital status: Married    Spouse name: Not on file  . Number of children: 2  . Years of education: Not on file  . Highest education level: Not on file  Social Needs  . Financial resource strain: Not on file  . Food insecurity - worry: Not on file  . Food insecurity - inability: Not on file  . Transportation needs - medical: Not on file  . Transportation needs - non-medical: Not on file  Occupational History  . Not on file  Tobacco Use  . Smoking status: Never Smoker  . Smokeless tobacco: Never Used  Substance and Sexual  Activity  . Alcohol use: No    Comment: Occasional  . Drug use: No  . Sexual activity: No  Other Topics Concern  . Not on file  Social History Narrative  . Not on file    No Known Allergies  Prior to Admission medications   Medication Sig Start Date End Date Taking? Authorizing Provider  ARIPiprazole (ABILIFY) 2 MG tablet Take 2 mg by mouth daily.     [provider]  cloNIDine (CATAPRES) 0.1 MG tablet Take 0.05 mg by mouth daily.     [provider]  hydrochlorothiazide (MICROZIDE) 12.5 MG capsule TAKE 1 CAPSULE(12.5 MG) BY MOUTH DAILY 11/03/17   Copland, Karleen HampshireSpencer, MD  medroxyPROGESTERone (PROVERA) 10 MG tablet Take 1 tablet (10 mg total) by mouth daily for 10 days. Take calendar days 1-10 each month 12/15/17 12/25/17  Conard NovakJackson, Libertie Hausler D, MD    Physical Exam BP 118/74   Ht 5\' 3"  (1.6 m)   Wt 220 lb (99.8 kg)   BMI 38.97 kg/m    General: NAD HEENT: normocephalic, anicteric Pulmonary: No increased work of breathing Extremities: no edema, erythema, or tenderness Neurologic: Grossly intact, normal gait Psychiatric: mood appropriate, affect full   Assessment: 32 y.o. Z6X0960G3P2012 here for  1. Secondary oligomenorrhea     Plan: Problem List Items Addressed This Visit  Other   Oligomenorrhea - Primary (Chronic)   Relevant Orders   Beta HCG, Quant     Will get a quant hCG in mid February. If negative, she can start taking provera.  Discussed taking provera regularly to have her have a menstruation. She does not want to get pregnant at this time. Discussed longer-acting forms of contraception that would help give her a menses or at least give her uterus protection from estrogen.  She will consider .  15 minutes spent in face to face discussion with > 50% spent in counseling,management, and coordination of care of her oligomenorrhea.   Thomasene Mohair, MD 12/27/2017 12:48 PM

## 2017-12-27 ENCOUNTER — Encounter: Payer: Self-pay | Admitting: Obstetrics and Gynecology

## 2017-12-28 ENCOUNTER — Other Ambulatory Visit: Payer: Self-pay | Admitting: Obstetrics and Gynecology

## 2017-12-28 ENCOUNTER — Other Ambulatory Visit: Payer: BLUE CROSS/BLUE SHIELD

## 2017-12-28 DIAGNOSIS — N914 Secondary oligomenorrhea: Secondary | ICD-10-CM | POA: Diagnosis not present

## 2017-12-29 ENCOUNTER — Encounter: Payer: Self-pay | Admitting: Obstetrics and Gynecology

## 2017-12-29 LAB — BETA HCG QUANT (REF LAB): hCG Quant: <1 m[IU]/mL

## 2017-12-30 ENCOUNTER — Ambulatory Visit: Payer: BLUE CROSS/BLUE SHIELD | Admitting: Obstetrics and Gynecology

## 2017-12-30 ENCOUNTER — Other Ambulatory Visit: Payer: BLUE CROSS/BLUE SHIELD

## 2017-12-30 ENCOUNTER — Ambulatory Visit (INDEPENDENT_AMBULATORY_CARE_PROVIDER_SITE_OTHER): Payer: BLUE CROSS/BLUE SHIELD | Admitting: Obstetrics and Gynecology

## 2017-12-30 VITALS — BP 124/78 | Ht 63.0 in | Wt 221.0 lb

## 2017-12-30 DIAGNOSIS — Z308 Encounter for other contraceptive management: Secondary | ICD-10-CM | POA: Diagnosis not present

## 2017-12-30 DIAGNOSIS — N914 Secondary oligomenorrhea: Secondary | ICD-10-CM | POA: Diagnosis not present

## 2017-12-31 ENCOUNTER — Ambulatory Visit: Payer: BLUE CROSS/BLUE SHIELD | Admitting: Obstetrics and Gynecology

## 2017-12-31 ENCOUNTER — Encounter: Payer: Self-pay | Admitting: Obstetrics and Gynecology

## 2017-12-31 NOTE — Progress Notes (Signed)
Obstetrics & Gynecology Office Visit   Chief Complaint  Patient presents with  . Follow-up  lab results and ultrasound and pregnancy prevention.  History of Present Illness: 32 y.o. G80P2012 female who presents to discuss next steps. She has been having cramping-like symptoms making her think she is getting ready to have a period.  She would like to prevent pregnancy for a while.  We have discussed provera for withdrawal bleeding. However, she continues to have concerns about pregnancy.    Past Medical History:  Diagnosis Date  . Asthma, chronic 06/15/2013  . Cleft palate   . History of abnormal cervical Pap smear 2014   LGSIL with positive HRHPV  . 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 Hypertension   . Tourette's disorder    Dr. Zola Button    Past Surgical History:  Procedure Laterality Date  . COLPOSCOPY  2009  . MYRINGOTOMY     bilateral  . Pallet repair  1990    Gynecologic History: No LMP recorded.  Obstetric History: [id number]  Family History  Problem Relation Age of Onset  . Healthy Father   . Healthy Mother   . Stroke Paternal Grandmother   . Heart attack Paternal Grandmother   . Diabetes Maternal Grandmother   . Cancer Neg Hx     Social History   Socioeconomic History  . Marital status: Married    Spouse name: Not on file  . Number of children: 2  . Years of education: Not on file  . Highest education level: Not on file  Social Needs  . Financial resource strain: Not on file  . Food insecurity - worry: Not on file  . Food insecurity - inability: Not on file  . Transportation needs - medical: Not on file  . Transportation needs - non-medical: Not on file  Occupational History  . Not on file  Tobacco Use  . Smoking status: Never Smoker  . Smokeless tobacco: Never Used  Substance and Sexual Activity  . Alcohol use: No    Comment: Occasional  . Drug use: No  . Sexual activity: No  Other Topics Concern  . Not  on file  Social History Narrative  . Not on file    No Known Allergies  Prior to Admission medications   Medication Sig Start Date End Date Taking? Authorizing Provider  ARIPiprazole (ABILIFY) 2 MG tablet Take 2 mg by mouth daily.     [provider]  cloNIDine (CATAPRES) 0.1 MG tablet Take 0.05 mg by mouth daily.     [provider]  hydrochlorothiazide (MICROZIDE) 12.5 MG capsule TAKE 1 CAPSULE(12.5 MG) BY MOUTH DAILY 11/03/17   Copland, Karleen Hampshire, MD  medroxyPROGESTERone (PROVERA) 10 MG tablet Take 1 tablet (10 mg total) by mouth daily for 10 days. Take calendar days 1-10 each month 12/15/17 12/25/17  Conard Novak, MD    Review of Systems  Constitutional: Negative.   HENT: Negative.   Eyes: Negative.   Respiratory: Negative.   Cardiovascular: Negative.   Gastrointestinal: Negative.   Genitourinary: Negative.   Musculoskeletal: Negative.   Skin: Negative.   Neurological: Negative.   Psychiatric/Behavioral: Negative.      Physical Exam BP 124/78   Ht 5\' 3"  (1.6 m)   Wt 221 lb (100.2 kg)   BMI 39.15 kg/m  No LMP recorded. Physical Exam  Constitutional: She is oriented to person, place, and time. She appears well-developed and well-nourished. No distress.  HENT:  Head: Normocephalic and atraumatic.  Eyes: Conjunctivae are normal. No scleral icterus.  Neurological: She is alert and oriented to person, place, and time. No cranial nerve deficit.  Psychiatric: She has a normal mood and affect. Her behavior is normal. Judgment normal.   Assessment: 32 y.o. [id number] female here for  1. Secondary oligomenorrhea   2. Encounter for other contraceptive management    Plan: Problem List Items Addressed This Visit      Other   Oligomenorrhea - Primary (Chronic)    Other Visit Diagnoses    Encounter for other contraceptive management         Discussed various methods of contraception. Ultimately, discussed IUD.  She will consider. Will give her a withdrawal  bleed with Provera and at the end of that bleeding episode will consider getting IUD placed. This would offer good contraception and endometrial protection from hyperestrogen state.  15 minutes spent in face to face discussion with > 50% spent in counseling,management, and coordination of care of her oligomenorrhea and contraception counseling.   Thomasene MohairStephen Wister Hoefle, MD 12/31/2017 9:09 PM

## 2018-01-06 ENCOUNTER — Ambulatory Visit (INDEPENDENT_AMBULATORY_CARE_PROVIDER_SITE_OTHER): Payer: BLUE CROSS/BLUE SHIELD | Admitting: Obstetrics and Gynecology

## 2018-01-06 ENCOUNTER — Ambulatory Visit (INDEPENDENT_AMBULATORY_CARE_PROVIDER_SITE_OTHER): Payer: BLUE CROSS/BLUE SHIELD

## 2018-01-06 VITALS — BP 132/84 | HR 98 | Ht 63.0 in | Wt 222.7 lb

## 2018-01-06 DIAGNOSIS — I10 Essential (primary) hypertension: Secondary | ICD-10-CM | POA: Diagnosis not present

## 2018-01-06 DIAGNOSIS — N912 Amenorrhea, unspecified: Secondary | ICD-10-CM

## 2018-01-06 DIAGNOSIS — E669 Obesity, unspecified: Secondary | ICD-10-CM | POA: Diagnosis not present

## 2018-01-06 NOTE — Progress Notes (Signed)
GYN ENCOUNTER NOTE  Subjective:       Caroline Fletcher is a 32 y.o. [phone] female is here for gynecologic evaluation of the following issues:  1.  Second opinion 2.  Oligomenorrhea/amenorrhea  History of irregular menstrual cycles with LMP 11/21/2017; previous LMP was July 2018; no interval bleeding.  The patient states that her cycles were regular for the first 6 months after her second child, but has since become very irregular.  Patient is currently attempting to conceive. Ultrasound on 12/24/2016 revealed a slightly enlarged heterogenous uterus with prominent endometrial stripe without evidence of intrauterine pregnancy; quantitative hCG was negative. Patient states that her pregnancies are never verified by early positive pregnancy tests and she would like reassurance that she is not currently pregnant before taking Provera withdrawal.  12/15/2017 quantitative hCG less than 1 09/30/2017 quantitative hCG less than 1 08/19/2017 quantitative hCG less than 1  12/24/2017 ULTRASOUND REPORT  Location: [facility]  Date of Service: 12/24/2017    Indications:Oligomenorrhea Findings:  The uterus is anteverted and measures 10.65 x 6.55 x 5.84 cm. Echo texture is heterogenous without evidence of focal masses.  The Endometrium measures 10.52 mm.  Right Ovary measures 3.82 x 2.03 x 2.56 cm. It is normal in appearance. Left Ovary measures 3.61 x 1.84 x 1.62 cm. It is normal appearance. Survey of the adnexa demonstrates no adnexal masses. There is no free fluid in the cul de sac.  Impression: 1.Small cystic area in EM cavity = 2.8 mm 2. It appears to be a normal [place]  Recommendations: 1.Clinical correlation with the patient's History and Physical Exam.   Mital bahen Leodis Binet, RDMS  The ultrasound images and findings were reviewed by me and I agree with the above report.  Thomasene Mohair, MD 12/27/2017 12:45 PM        Obstetric History OB History  Gravida Para Term Preterm  AB Living  3 2 2   1 2   SAB TAB Ectopic Multiple Live Births  1     0 2    # Outcome Date GA Lbr Len/2nd Weight Sex Delivery Anes PTL Lv  3 Term 08/23/16 [redacted]w[redacted]d / 01:42 7 lb 2.6 oz (3.25 kg) F Vag-Spont EPI  LIV  2 Term 10/31/14   7 lb 5 oz (3.317 kg)  Vag-Spont   LIV  1 SAB      SAB         Past Medical History:  Diagnosis Date  . Asthma, chronic 06/15/2013  . Cleft palate   . History of abnormal cervical Pap smear 2014   LGSIL with positive HRHPV  . 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 Hypertension   . Tourette's disorder    Dr. Zola Button    Past Surgical History:  Procedure Laterality Date  . COLPOSCOPY  2009  . MYRINGOTOMY     bilateral  . Pallet repair  1990    Current Outpatient Medications on File Prior to Visit  Medication Sig Dispense Refill  . ARIPiprazole (ABILIFY) 2 MG tablet Take 2 mg by mouth daily.     . cloNIDine (CATAPRES) 0.1 MG tablet Take 0.05 mg by mouth daily.     . hydrochlorothiazide (MICROZIDE) 12.5 MG capsule TAKE 1 CAPSULE(12.5 MG) BY MOUTH DAILY 90 capsule 0   No current facility-administered medications on file prior to visit.     No Known Allergies  Social History   Socioeconomic History  . Marital status: Married  Spouse name: Not on file  . Number of children: 2  . Years of education: Not on file  . Highest education level: Not on file  Social Needs  . Financial resource strain: Not on file  . Food insecurity - worry: Not on file  . Food insecurity - inability: Not on file  . Transportation needs - medical: Not on file  . Transportation needs - non-medical: Not on file  Occupational History  . Not on file  Tobacco Use  . Smoking status: Never Smoker  . Smokeless tobacco: Never Used  Substance and Sexual Activity  . Alcohol use: No    Comment: Occasional  . Drug use: No  . Sexual activity: No  Other Topics Concern  . Not on file  Social History Narrative  . Not on file     Family History  Problem Relation Age of Onset  . Healthy Father   . Healthy Mother   . Stroke Paternal Grandmother   . Heart attack Paternal Grandmother   . Diabetes Maternal Grandmother   . Cancer Neg Hx     The following portions of the patient's history were reviewed and updated as appropriate: allergies, current medications, past family history, past medical history, past social history, past surgical history and problem list.  Review of Systems Review of Systems - Negative except For amenorrhea   Objective:   BP 132/84   Pulse 98   Ht 5\' 3"  (1.6 m)   Wt 222 lb 11.2 oz (101 kg)   LMP 11/21/2017 (Exact Date)   Breastfeeding? No   BMI 39.45 kg/m  Physical exam deferred   Assessment:   1.  Amenorrhea 2.  History of irregular menstrual cycles 3.  Obesity 4.  Hypertension 5.  Desires conception  The patient likely has PCO with oligomenorrhea/amenorrhea.  She has thickened endometrium on ultrasound without evidence of an intrauterine pregnancy and a negative quantitative hCG.  Repeat testing with ultrasound and hCG will be done per patient's request prior to completing a Provera withdrawal. The patient is interested in ovulation induction for pregnancy if her testing is negative. She does understand that if she is still attempting to conceive, she will need to be on Clomid therapy; if she is not desiring conception, then she should be on birth control pills or cyclic Provera or the Mirena IUD to prevent potential endometrial hyperplasia.  So long as her blood pressure is controlled, I am comfortable with her being on birth control pills until age 32.  Plan:   1.  Quantitative hCG 2.  Pelvic ultrasound 3.  Begin Clomid 50 mg a day days 5 through 9 after Provera withdrawal (following the results obtained from hCG and pelvic ultrasound) 4.  Patient will be instructed on timed colitis 5.  Day 22 serum progesterone will be recommended with Clomid therapy to verify  ovulation 6.  Patient is to be taking prenatal vitamins 7.  Patient will return in 2 weeks for further counseling regarding attempted conception with using Clomid  A total of 20 minutes were spent face-to-face with the patient during this encounter and over half of that time dealt with counseling and coordination of care.  Herold HarmsMartin A Nahomi Hegner, MD  Note: This dictation was prepared with Dragon dictation along with smaller phrase technology. Any transcriptional errors that result from this process are unintentional.

## 2018-01-06 NOTE — Patient Instructions (Signed)
1.  Quantitative hCG is ordered today 2.  Pelvic ultrasound is ordered 3.  Once results from pelvic ultrasound and hCG are available, begin taking Provera 10 mg a day for 10 days 4.  Return in 2 weeks for follow-up

## 2018-01-07 LAB — BETA HCG QUANT (REF LAB): hCG Quant: <1 m[IU]/mL

## 2018-01-10 ENCOUNTER — Other Ambulatory Visit: Payer: BLUE CROSS/BLUE SHIELD

## 2018-01-19 ENCOUNTER — Encounter: Payer: BLUE CROSS/BLUE SHIELD | Admitting: Obstetrics and Gynecology

## 2018-01-20 ENCOUNTER — Encounter: Payer: BLUE CROSS/BLUE SHIELD | Admitting: Obstetrics and Gynecology

## 2018-01-20 ENCOUNTER — Encounter: Payer: Self-pay | Admitting: Emergency Medicine

## 2018-01-20 ENCOUNTER — Other Ambulatory Visit: Payer: Self-pay

## 2018-01-20 ENCOUNTER — Emergency Department
Admission: EM | Admit: 2018-01-20 | Discharge: 2018-01-20 | Disposition: A | Discharge status: Home / Self Care | Payer: Self-pay | Attending: Emergency Medicine | Admitting: Emergency Medicine

## 2018-01-20 DIAGNOSIS — Z79899 Other long term (current) drug therapy: Secondary | ICD-10-CM | POA: Insufficient documentation

## 2018-01-20 DIAGNOSIS — J45909 Unspecified asthma, uncomplicated: Secondary | ICD-10-CM | POA: Insufficient documentation

## 2018-01-20 DIAGNOSIS — J988 Other specified respiratory disorders: Secondary | ICD-10-CM

## 2018-01-20 DIAGNOSIS — B9789 Other viral agents as the cause of diseases classified elsewhere: Secondary | ICD-10-CM

## 2018-01-20 DIAGNOSIS — J989 Respiratory disorder, unspecified: Secondary | ICD-10-CM | POA: Insufficient documentation

## 2018-01-20 DIAGNOSIS — I10 Essential (primary) hypertension: Secondary | ICD-10-CM | POA: Insufficient documentation

## 2018-01-20 DIAGNOSIS — R509 Fever, unspecified: Secondary | ICD-10-CM | POA: Insufficient documentation

## 2018-01-20 MED ORDER — HYDROCOD POLST-CPM POLST ER 10-8 MG/5ML PO SUER: 5.0000 mL | Freq: Two times a day (BID) | ORAL | 0 refills | Status: DC

## 2018-01-20 NOTE — Discharge Instructions (Signed)
1.  Alternate Tylenol and ibuprofen every 4 hours as needed for fever greater than 100.4 F. 2.  You may take cough medicine as needed (Tussionex). 3.  Return to the ER for worsening symptoms, persistent vomiting difficulty breathing or other concerns.

## 2018-01-20 NOTE — ED Notes (Signed)
Pt here with child who has had a fever on and off x 2 days. Pt feels congested and states she has had a fever also

## 2018-01-20 NOTE — ED Triage Notes (Signed)
Patient ambulatory to triage with steady gait, without difficulty or distress noted; mom reports congestion and fever x 2 days; here with child also being seen for fever

## 2018-01-20 NOTE — ED Provider Notes (Signed)
[facility] Emergency Department Provider Note   ____________________________________________   First MD Initiated Contact with Patient 01/20/18 [phone]     (approximate)  I have reviewed the triage vital signs and the nursing notes.   HISTORY  Chief Complaint Fever    HPI Caroline Fletcher is a 32 y.o. female who presents to the ED from home with a chief complaint of flulike symptoms.  Patient reports a 1 day history of fever, nasal congestion, cough productive of yellow sputum.  Son is also sick with similar symptoms.  Patient denies chest pain, shortness of breath, abdominal pain, nausea, vomiting, dysuria, diarrhea.  Denies recent travel or trauma.   Past Medical History:  Diagnosis Date  . Asthma, chronic 06/15/2013  . Cleft palate   . History of abnormal cervical Pap smear 2014   LGSIL with positive HRHPV  . 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 Hypertension   . Tourette's disorder    Dr. Zola Button    Patient Active Problem List   Diagnosis Date Noted  . Obesity (BMI 35.0-39.9 without comorbidity) 01/06/2018  . Oligomenorrhea 09/08/2017  . Nausea 02/24/2017  . Leg swelling 02/24/2017  . Asthma, chronic 06/15/2013  . TOURETTE'S DISORDER ([facility]) 03/07/2008    Past Surgical History:  Procedure Laterality Date  . COLPOSCOPY  2009  . MYRINGOTOMY     bilateral  . Pallet repair  1990    Prior to Admission medications   Medication Sig Start Date End Date Taking? Authorizing Provider  ARIPiprazole (ABILIFY) 2 MG tablet Take 2 mg by mouth daily.     [provider]  chlorpheniramine-HYDROcodone (TUSSIONEX PENNKINETIC ER) 10-8 MG/5ML SUER Take 5 mLs by mouth 2 (two) times daily. 01/20/18   Irean Hong, MD  cloNIDine (CATAPRES) 0.1 MG tablet Take 0.05 mg by mouth daily.     [provider]  hydrochlorothiazide (MICROZIDE) 12.5 MG capsule TAKE 1 CAPSULE(12.5 MG) BY MOUTH DAILY 11/03/17    Copland, Karleen Hampshire, MD    Allergies Patient has no known allergies.  Family History  Problem Relation Age of Onset  . Healthy Father   . Healthy Mother   . Stroke Paternal Grandmother   . Heart attack Paternal Grandmother   . Diabetes Maternal Grandmother   . Cancer Neg Hx     Social History Social History   Tobacco Use  . Smoking status: Never Smoker  . Smokeless tobacco: Never Used  Substance Use Topics  . Alcohol use: No    Comment: Occasional  . Drug use: No    Review of Systems  Constitutional: Positive for fever/chills.  Positive for myalgias. Eyes: No visual changes. ENT: Positive for nasal congestion.  No sore throat. Cardiovascular: Denies chest pain. Respiratory: Positive for cough.  Denies shortness of breath. Gastrointestinal: No abdominal pain.  No nausea, no vomiting.  No diarrhea.  No constipation. Genitourinary: Negative for dysuria. Musculoskeletal: Negative for back pain. Skin: Negative for rash. Neurological: Negative for headaches, focal weakness or numbness.   ____________________________________________   PHYSICAL EXAM:  VITAL SIGNS: ED Triage Vitals  Enc Vitals Group     BP 01/20/18 0517 128/88     Pulse Rate 01/20/18 0517 (!) 107     Resp 01/20/18 0517 18     Temp 01/20/18 0517 98.3 F (36.8 C)     Temp Source 01/20/18 0517 Oral     SpO2 01/20/18 0517 99 %     Weight 01/20/18  0513 215 lb (97.5 kg)     Height 01/20/18 0513 5\' 3"  (1.6 m)     Head Circumference --      Peak Flow --      Pain Score --      Pain Loc --      Pain Edu? --      Excl. in GC? --     Constitutional: Alert and oriented. Well appearing and in no acute distress. Eyes: Conjunctivae are normal. PERRL. EOMI. Head: Atraumatic. Ears: Bilateral TM dullness. Nose: Congestion/rhinnorhea. Mouth/Throat: Mucous membranes are moist.  Oropharynx non-erythematous. Neck: No stridor.  Supple neck without meningismus. Hematological/Lymphatic/Immunilogical: No cervical  lymphadenopathy. Cardiovascular: Normal rate, regular rhythm. Grossly normal heart sounds.  Good peripheral circulation. Respiratory: Normal respiratory effort.  No retractions. Lungs CTAB. Gastrointestinal: Soft and nontender. No distention. No abdominal bruits. No CVA tenderness. Musculoskeletal: No lower extremity tenderness nor edema.  No joint effusions. Neurologic:  Normal speech and language. No gross focal neurologic deficits are appreciated. No gait instability. Skin:  Skin is warm, dry and intact. No rash noted.  No petechiae. Psychiatric: Mood and affect are normal. Speech and behavior are normal.  ____________________________________________   LABS (all labs ordered are listed, but only abnormal results are displayed)  Labs Reviewed - No data to display ____________________________________________  EKG  None ____________________________________________  RADIOLOGY  ED MD interpretation: None  Official radiology report(s): No results found.  ____________________________________________   PROCEDURES  Procedure(s) performed: None  Procedures  Critical Care performed: No  ____________________________________________   INITIAL IMPRESSION / ASSESSMENT AND PLAN / ED COURSE  As part of my medical decision making, I reviewed the following data within the electronic MEDICAL RECORD NUMBER Nursing notes reviewed and incorporated, Old chart reviewed and Notes from prior ED visits   32 year old female who presents with influenza-like symptoms.  I did offer Tamiflu.  Patient declines because she does not like to take medicines.  She does agree to prescription of Tussionex for her cough.  Strict return precautions given.  Patient verbalizes understanding and agrees with plan of care.      ____________________________________________   FINAL CLINICAL IMPRESSION(S) / ED DIAGNOSES  Final diagnoses:  Fever, unspecified fever cause  Viral respiratory illness     ED  Discharge Orders        Ordered    chlorpheniramine-HYDROcodone (TUSSIONEX PENNKINETIC ER) 10-8 MG/5ML SUER  2 times daily     01/20/18 0557       Note:  This document was prepared using Dragon voice recognition software and may include unintentional dictation errors.    Irean HongSung, Jade J, MD 01/20/18 [phone]

## 2018-01-30 DIAGNOSIS — J019 Acute sinusitis, unspecified: Secondary | ICD-10-CM | POA: Diagnosis not present

## 2018-01-30 DIAGNOSIS — H9203 Otalgia, bilateral: Secondary | ICD-10-CM | POA: Diagnosis not present

## 2018-01-31 ENCOUNTER — Other Ambulatory Visit: Payer: Self-pay | Admitting: Family Medicine

## 2018-01-31 MED ORDER — HYDROCHLOROTHIAZIDE 12.5 MG PO CAPS: ORAL_CAPSULE | ORAL | 0 refills | Status: DC

## 2018-01-31 NOTE — Telephone Encounter (Signed)
Rx refill request of historical medication: Microzide 12.5 mg  LOV: 01/21/17  PCP: Copland ( patient has not established with different provider)  Pharmacy: verified

## 2018-01-31 NOTE — Telephone Encounter (Signed)
Copied from CRM [id number]. Topic: General - Other >> Jan 31, 2018 10:42 AM Cecelia ByarsGreen, Lyndel Dancel L, RMA wrote: Reason for CRM: Medication refill request for hydrochlorothiazide (MICROZIDE) 12.5 MG to be sent to [facility] [address], pt is completely out of medication

## 2018-02-01 ENCOUNTER — Other Ambulatory Visit: Payer: Self-pay | Admitting: Family Medicine

## 2018-02-24 ENCOUNTER — Telehealth: Payer: Self-pay

## 2018-02-24 NOTE — Telephone Encounter (Signed)
Caroline Fletcher notified by telephone that a PPD are usually required annually if it is for employment.  She states that it is for a job at [employer] and she has her health screening appointment with them tomorrow.  She will have them do her PPD there.

## 2018-02-24 NOTE — Telephone Encounter (Signed)
Copied from CRM [phone]. Topic: Inquiry >> Feb 24, 2018  8:02 AM Eston Mouldavis, Cheri B wrote: Reason for CRM: Pt is asking for a copy of her TB skin test results  from Aug. 2016   She also is asking if this test would be considered current.  She wants to pick up a copy at office if possible.

## 2018-02-24 NOTE — Telephone Encounter (Signed)
Unable to reach pt by phone; v/m has not been set up.

## 2018-02-24 NOTE — Telephone Encounter (Signed)
Unable to reach pt by phone; v/m has not been set up.  Will print copy of PPD done in 2016 but that would not be considered a current test.  PPDs are usually required annually.

## 2018-03-03 ENCOUNTER — Other Ambulatory Visit: Payer: Self-pay | Admitting: Family Medicine

## 2018-03-04 ENCOUNTER — Other Ambulatory Visit: Payer: BLUE CROSS/BLUE SHIELD

## 2018-03-04 ENCOUNTER — Other Ambulatory Visit: Payer: Self-pay | Admitting: Obstetrics and Gynecology

## 2018-03-04 DIAGNOSIS — N926 Irregular menstruation, unspecified: Secondary | ICD-10-CM | POA: Diagnosis not present

## 2018-03-05 LAB — BETA HCG QUANT (REF LAB)

## 2018-03-07 ENCOUNTER — Encounter: Payer: Self-pay | Admitting: Obstetrics and Gynecology

## 2018-03-07 ENCOUNTER — Telehealth: Payer: Self-pay | Admitting: Obstetrics and Gynecology

## 2018-03-07 NOTE — Telephone Encounter (Signed)
Results sent through MyChart. I'm happy to call her in the morning (4/16), if she would like to discuss further.

## 2018-03-07 NOTE — Telephone Encounter (Signed)
Please call patient with lab results from Friday

## 2018-03-08 ENCOUNTER — Other Ambulatory Visit: Payer: Self-pay | Admitting: Obstetrics and Gynecology

## 2018-03-08 DIAGNOSIS — N921 Excessive and frequent menstruation with irregular cycle: Secondary | ICD-10-CM

## 2018-03-10 ENCOUNTER — Encounter: Payer: Self-pay | Admitting: Obstetrics and Gynecology

## 2018-03-10 ENCOUNTER — Ambulatory Visit (INDEPENDENT_AMBULATORY_CARE_PROVIDER_SITE_OTHER): Payer: Medicaid Other | Admitting: Obstetrics and Gynecology

## 2018-03-10 ENCOUNTER — Ambulatory Visit (INDEPENDENT_AMBULATORY_CARE_PROVIDER_SITE_OTHER): Payer: Medicaid Other

## 2018-03-10 VITALS — BP 124/74 | Ht 63.0 in | Wt 220.0 lb

## 2018-03-10 DIAGNOSIS — N921 Excessive and frequent menstruation with irregular cycle: Secondary | ICD-10-CM

## 2018-03-10 DIAGNOSIS — N83202 Unspecified ovarian cyst, left side: Secondary | ICD-10-CM | POA: Diagnosis not present

## 2018-03-10 DIAGNOSIS — N914 Secondary oligomenorrhea: Secondary | ICD-10-CM

## 2018-03-10 NOTE — Progress Notes (Signed)
Gynecology Ultrasound Follow Up  Chief Complaint: oligomenorrhea  History of Present Illness: Patient is a 32 y.o. female who presents today for ultrasound evaluation of the above .  Ultrasound demonstrates the following findings Adnexa: cyst seen measuring 3.6 x 2.1 cm  Uterus: anteverted with endometrial stripe  7.4 mm Additional: Otherwise normal pelvic ultrasound  Past Medical History:  Diagnosis Date  . Asthma, chronic 06/15/2013  . Cleft palate   . History of abnormal cervical Pap smear 2014   LGSIL with positive HRHPV  . 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 Hypertension   . Tourette's disorder    Dr. Zola Button    Past Surgical History:  Procedure Laterality Date  . COLPOSCOPY  2009  . MYRINGOTOMY     bilateral  . Pallet repair  1990    Family History  Problem Relation Age of Onset  . Healthy Father   . Healthy Mother   . Stroke Paternal Grandmother   . Heart attack Paternal Grandmother   . Diabetes Maternal Grandmother   . Cancer Neg Hx     Social History   Socioeconomic History  . Marital status: Married    Spouse name: Not on file  . Number of children: 2  . Years of education: Not on file  . Highest education level: Not on file  Occupational History  . Not on file  Social Needs  . Financial resource strain: Not on file  . Food insecurity:    Worry: Not on file    Inability: Not on file  . Transportation needs:    Medical: Not on file    Non-medical: Not on file  Tobacco Use  . Smoking status: Never Smoker  . Smokeless tobacco: Never Used  Substance and Sexual Activity  . Alcohol use: No    Comment: Occasional  . Drug use: No  . Sexual activity: Never  Lifestyle  . Physical activity:    Days per week: Not on file    Minutes per session: Not on file  . Stress: Not on file  Relationships  . Social connections:    Talks on phone: Not on file    Gets together: Not on file    Attends religious  service: Not on file    Active member of club or organization: Not on file    Attends meetings of clubs or organizations: Not on file    Relationship status: Not on file  . Intimate partner violence:    Fear of current or ex partner: Not on file    Emotionally abused: Not on file    Physically abused: Not on file    Forced sexual activity: Not on file  Other Topics Concern  . Not on file  Social History Narrative  . Not on file    No Known Allergies  Prior to Admission medications   Medication Sig Start Date End Date Taking? Authorizing Provider  ARIPiprazole (ABILIFY) 2 MG tablet Take 2 mg by mouth daily.     [provider]  chlorpheniramine-HYDROcodone (TUSSIONEX PENNKINETIC ER) 10-8 MG/5ML SUER Take 5 mLs by mouth 2 (two) times daily. 01/20/18   Irean Hong, MD  cloNIDine (CATAPRES) 0.1 MG tablet Take 0.05 mg by mouth daily.     [provider]  hydrochlorothiazide (MICROZIDE) 12.5 MG capsule TAKE ONE CAPSULE BY MOUTH DAILY 03/03/18   Hannah Beat, MD    Physical Exam BP 124/74   Ht 5'  3" (1.6 m)   Wt 220 lb (99.8 kg)   BMI 38.97 kg/m    General: NAD HEENT: normocephalic, anicteric Pulmonary: No increased work of breathing Extremities: no edema, erythema, or tenderness Neurologic: Grossly intact, normal gait Psychiatric: mood appropriate, affect full   Assessment: 32 y.o. [id number]  1. Secondary oligomenorrhea      Plan: Problem List Items Addressed This Visit      Other   Oligomenorrhea - Primary (Chronic)     Reviewed results of ultrasound and reassurance provided.  Discussed her condition of oligomenorrhea and she is not currently desiring pregnancy. She would likely need ovulation induction, if she decided to become pregnant.  Reiterated to her the importance of taking her progestin medication in order to protect her endometrium from a likely estrogen-excess environment.    15 minutes spent in face to face discussion with > 50% spent in  counseling,management, and coordination of care of her oligomenorrhea.  Thomasene MohairStephen Americo Vallery, MD, Merlinda FrederickFACOG [facility], [facility] 03/11/2018 5:02 PM

## 2018-03-11 ENCOUNTER — Encounter: Payer: Self-pay | Admitting: Obstetrics and Gynecology

## 2018-03-14 ENCOUNTER — Ambulatory Visit: Payer: Self-pay | Admitting: Family Medicine

## 2018-03-16 ENCOUNTER — Other Ambulatory Visit: Payer: Self-pay | Admitting: *Deleted

## 2018-03-16 ENCOUNTER — Encounter: Payer: Self-pay | Admitting: Obstetrics and Gynecology

## 2018-03-16 ENCOUNTER — Ambulatory Visit (INDEPENDENT_AMBULATORY_CARE_PROVIDER_SITE_OTHER): Payer: Self-pay | Admitting: Family Medicine

## 2018-03-16 ENCOUNTER — Other Ambulatory Visit: Payer: Self-pay

## 2018-03-16 ENCOUNTER — Encounter: Payer: Self-pay | Admitting: Family Medicine

## 2018-03-16 VITALS — BP 120/80 | HR 102 | Temp 97.9°F | Ht 62.75 in | Wt 223.0 lb

## 2018-03-16 DIAGNOSIS — N912 Amenorrhea, unspecified: Secondary | ICD-10-CM

## 2018-03-16 DIAGNOSIS — I10 Essential (primary) hypertension: Secondary | ICD-10-CM

## 2018-03-16 LAB — POCT URINE PREGNANCY: PREG TEST UR: NEGATIVE

## 2018-03-16 MED ORDER — HYDROCHLOROTHIAZIDE 12.5 MG PO CAPS: ORAL_CAPSULE | ORAL | 3 refills | Status: DC

## 2018-03-16 NOTE — Progress Notes (Signed)
Dr. Karleen HampshireSpencer T. Taaj Hurlbut, MD, CAQ Sports Medicine Primary [facility] and Sports Medicine [address] [place], [id number] Phone: [phone] Fax: [phone]  03/16/2018  Patient: Caroline Fletcher, MRN: [id number], DOB: Sep 14, 1986, 32 y.o.  Primary Physician:  Hannah Beatopland, Litsy Epting, MD   Chief Complaint  Patient presents with  . Medication Refill    HCTZ  . Amenorrhea   Subjective:   Caroline Fletcher is a 32 y.o. very pleasant female patient who presents with the following:  Turned 32 today.   UPT neg.   HTN: Tolerating all medications without side effects Stable and at goal No CP, no sob. No HA.  BP Readings from Last 3 Encounters:  03/16/18 120/80  03/10/18 124/74  01/20/18 128/88    Basic Metabolic Panel:    Component Value Date/Time   NA 142 01/21/2017 1918   NA 135 (L) 05/10/2014 1456   K 3.7 01/21/2017 1918   K 4.2 05/10/2014 1456   CL 99 01/21/2017 1918   CL 102 05/10/2014 1456   CO2 27 01/21/2017 1918   CO2 25 05/10/2014 1456   BUN 8 01/21/2017 1918   BUN 7 05/10/2014 1456   CREATININE 0.74 01/21/2017 1918   CREATININE 0.57 (L) 05/10/2014 1456   GLUCOSE 103 (H) 01/21/2017 1918   GLUCOSE 109 (H) 10/17/2015 1832   GLUCOSE 73 05/10/2014 1456   CALCIUM 9.0 01/21/2017 1918   CALCIUM 9.8 05/10/2014 1456     Past Medical History, Surgical History, Social History, Family History, Problem List, Medications, and Allergies have been reviewed and updated if relevant.  Patient Active Problem List   Diagnosis Date Noted  . Obesity (BMI 35.0-39.9 without comorbidity) 01/06/2018  . Oligomenorrhea 09/08/2017  . Nausea 02/24/2017  . Leg swelling 02/24/2017  . Asthma, chronic 06/15/2013  . TOURETTE'S DISORDER ([facility]) 03/07/2008    Past Medical History:  Diagnosis Date  . Asthma, chronic 06/15/2013  . Cleft palate   . History of abnormal cervical Pap smear 2014   LGSIL with positive HRHPV  . 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 Hypertension   . Tourette's disorder    Dr. Zola ButtonHaq    Past Surgical History:  Procedure Laterality Date  . COLPOSCOPY  2009  . MYRINGOTOMY     bilateral  . Pallet repair  1990    Social History   Socioeconomic History  . Marital status: Married    Spouse name: Not on file  . Number of children: 2  . Years of education: Not on file  . Highest education level: Not on file  Occupational History  . Not on file  Social Needs  . Financial resource strain: Not on file  . Food insecurity:    Worry: Not on file    Inability: Not on file  . Transportation needs:    Medical: Not on file    Non-medical: Not on file  Tobacco Use  . Smoking status: Never Smoker  . Smokeless tobacco: Never Used  Substance and Sexual Activity  . Alcohol use: No    Comment: Occasional  . Drug use: No  . Sexual activity: Never  Lifestyle  . Physical activity:    Days per week: Not on file    Minutes per session: Not on file  . Stress: Not on file  Relationships  . Social connections:    Talks on phone: Not on file    Gets together: Not on file  Attends religious service: Not on file    Active member of club or organization: Not on file    Attends meetings of clubs or organizations: Not on file    Relationship status: Not on file  . Intimate partner violence:    Fear of current or ex partner: Not on file    Emotionally abused: Not on file    Physically abused: Not on file    Forced sexual activity: Not on file  Other Topics Concern  . Not on file  Social History Narrative  . Not on file    Family History  Problem Relation Age of Onset  . Healthy Father   . Healthy Mother   . Stroke Paternal Grandmother   . Heart attack Paternal Grandmother   . Diabetes Maternal Grandmother   . Cancer Neg Hx     No Known Allergies  Medication list reviewed and updated in full in [facility].   GEN: No acute illnesses, no fevers, chills. GI: No n/v/d, eating normally Pulm:  No SOB Interactive and getting along well at home.  Otherwise, ROS is as per the HPI.  Objective:   BP 120/80   Pulse (!) 102   Temp 97.9 F (36.6 C) (Oral)   Ht 5' 2.75" (1.594 m)   Wt 223 lb (101.2 kg)   LMP 01/21/2018   BMI 39.82 kg/m   GEN: WDWN, NAD, Non-toxic, A & O x 3 HEENT: Atraumatic, Normocephalic. Neck supple. No masses, No LAD. Ears and Nose: No external deformity. CV: RRR, No M/G/R. No JVD. No thrill. No extra heart sounds. PULM: CTA B, no wheezes, crackles, rhonchi. No retractions. No resp. distress. No accessory muscle use. EXTR: No c/c/e NEURO Normal gait.  PSYCH: Normally interactive. Conversant. Not depressed or anxious appearing.  Calm demeanor.   Laboratory and Imaging Data:  Assessment and Plan:   Hypertension, unspecified type  Amenorrhea - Plan: POCT urine pregnancy  Doing well  Follow-up: No follow-ups on file.  Meds ordered this encounter  Medications  . hydrochlorothiazide (MICROZIDE) 12.5 MG capsule    Sig: TAKE ONE CAPSULE BY MOUTH DAILY    Dispense:  90 capsule    Refill:  3   Medications Discontinued During This Encounter  Medication Reason  . chlorpheniramine-HYDROcodone (TUSSIONEX PENNKINETIC ER) 10-8 MG/5ML SUER Completed Course  . fluticasone (FLONASE) 50 MCG/ACT nasal spray Completed Course  . hydrochlorothiazide (MICROZIDE) 12.5 MG capsule Reorder   Orders Placed This Encounter  Procedures  . POCT urine pregnancy    Signed,  Eastin Swing T. Reylynn Vanalstine, MD   Allergies as of 03/16/2018   No Known Allergies     Medication List        Accurate as of 03/16/18  4:07 PM. Always use your most recent med list.          ARIPiprazole 2 MG tablet Commonly known as:  ABILIFY Take 2 mg by mouth daily.   cloNIDine 0.1 MG tablet Commonly known as:  CATAPRES Take 0.05 mg by mouth daily.   hydrochlorothiazide 12.5 MG capsule Commonly known as:  MICROZIDE TAKE ONE CAPSULE BY MOUTH DAILY   medroxyPROGESTERone 10 MG  tablet Commonly known as:  PROVERA

## 2018-04-19 DIAGNOSIS — F952 Tourette's disorder: Secondary | ICD-10-CM | POA: Diagnosis not present

## 2018-05-12 ENCOUNTER — Encounter: Payer: Self-pay | Admitting: Obstetrics and Gynecology

## 2018-05-13 ENCOUNTER — Other Ambulatory Visit: Payer: Self-pay | Admitting: Obstetrics and Gynecology

## 2018-05-13 DIAGNOSIS — N912 Amenorrhea, unspecified: Secondary | ICD-10-CM

## 2018-05-16 ENCOUNTER — Other Ambulatory Visit: Payer: Medicaid Other

## 2018-05-16 DIAGNOSIS — N912 Amenorrhea, unspecified: Secondary | ICD-10-CM

## 2018-05-17 LAB — BETA HCG QUANT (REF LAB)

## 2018-05-19 ENCOUNTER — Other Ambulatory Visit: Payer: Self-pay | Admitting: Obstetrics and Gynecology

## 2018-05-19 DIAGNOSIS — N914 Secondary oligomenorrhea: Secondary | ICD-10-CM

## 2018-05-23 ENCOUNTER — Ambulatory Visit: Payer: Medicaid Other | Admitting: Obstetrics and Gynecology

## 2018-05-24 ENCOUNTER — Ambulatory Visit (INDEPENDENT_AMBULATORY_CARE_PROVIDER_SITE_OTHER): Payer: Self-pay

## 2018-05-24 ENCOUNTER — Other Ambulatory Visit: Payer: Medicaid Other

## 2018-05-24 DIAGNOSIS — N914 Secondary oligomenorrhea: Secondary | ICD-10-CM

## 2018-05-24 DIAGNOSIS — R9389 Abnormal findings on diagnostic imaging of other specified body structures: Secondary | ICD-10-CM

## 2018-05-27 ENCOUNTER — Ambulatory Visit: Payer: Medicaid Other | Admitting: Obstetrics and Gynecology

## 2018-05-31 ENCOUNTER — Other Ambulatory Visit: Payer: Medicaid Other

## 2018-05-31 ENCOUNTER — Ambulatory Visit: Payer: Medicaid Other | Admitting: Obstetrics and Gynecology

## 2018-06-01 ENCOUNTER — Telehealth: Payer: Self-pay | Admitting: Obstetrics and Gynecology

## 2018-06-01 NOTE — Telephone Encounter (Signed)
Patient would like her ultrasound results.  Patient has started her menses and was in a lot of pain on her follow up appointment.

## 2018-06-01 NOTE — Telephone Encounter (Signed)
Please let Caroline Fletcher know that essentially her ultrasound was normal. The main thing it indicated is that I need her to take a medication to make her have a period every month.  Otherwise, it was fine.

## 2018-06-02 ENCOUNTER — Encounter: Payer: Self-pay | Admitting: Obstetrics and Gynecology

## 2018-06-02 NOTE — Telephone Encounter (Signed)
Called pt. No answer. Will try later.  

## 2018-06-02 NOTE — Telephone Encounter (Signed)
Patient is returning missed call.

## 2018-06-09 ENCOUNTER — Other Ambulatory Visit: Payer: Self-pay | Admitting: Obstetrics and Gynecology

## 2018-06-09 DIAGNOSIS — N914 Secondary oligomenorrhea: Secondary | ICD-10-CM

## 2018-06-09 MED ORDER — MEDROXYPROGESTERONE ACETATE 10 MG PO TABS: 10.0000 mg | ORAL_TABLET | Freq: Every day | ORAL | 0 refills | Status: DC

## 2018-06-09 NOTE — Telephone Encounter (Signed)
Pt sent mychart message.  Closing encounter.

## 2018-06-13 DIAGNOSIS — R05 Cough: Secondary | ICD-10-CM | POA: Diagnosis not present

## 2018-06-13 DIAGNOSIS — J069 Acute upper respiratory infection, unspecified: Secondary | ICD-10-CM | POA: Diagnosis not present

## 2018-06-14 ENCOUNTER — Ambulatory Visit (INDEPENDENT_AMBULATORY_CARE_PROVIDER_SITE_OTHER): Payer: BLUE CROSS/BLUE SHIELD | Admitting: Obstetrics and Gynecology

## 2018-06-14 ENCOUNTER — Encounter: Payer: Self-pay | Admitting: Obstetrics and Gynecology

## 2018-06-14 VITALS — BP 128/82 | HR 92 | Ht 62.75 in | Wt 221.5 lb

## 2018-06-14 DIAGNOSIS — N898 Other specified noninflammatory disorders of vagina: Secondary | ICD-10-CM

## 2018-06-14 LAB — POCT WET PREP WITH KOH
Clue Cells Wet Prep HPF POC: NEGATIVE
KOH PREP POC: NEGATIVE
TRICHOMONAS UA: NEGATIVE
YEAST WET PREP PER HPF POC: NEGATIVE

## 2018-06-14 NOTE — Patient Instructions (Signed)
I value your feedback and entrusting us with your care. If you get a [facility] patient survey, I would appreciate you taking the time to let us know about your experience today. Thank you! 

## 2018-06-14 NOTE — Progress Notes (Signed)
Hannah Beatopland, Spencer, MD   Chief Complaint  Patient presents with  . Gynecologic Exam    vaginal d/c    HPI:      Ms. Gregery Nashley Bedwell is a 32 y.o. [id number] who LMP was Patient's last menstrual period was 05/27/2018., presents today for increased vag d/c without irritation/odor since last night. She has been on abx for respiratory issues since yesterday. She is sex active, no new partners. Hx of oligomenorrhea but had cycle on her own 7/19.  Pt current with pap.   Past Medical History:  Diagnosis Date  . Asthma, chronic 06/15/2013  . Cleft palate   . History of abnormal cervical Pap smear 2014   LGSIL with positive HRHPV  . 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 Hypertension   . Tourette's disorder    Dr. Zola ButtonHaq    Past Surgical History:  Procedure Laterality Date  . COLPOSCOPY  2009  . MYRINGOTOMY     bilateral  . Pallet repair  1990    Family History  Problem Relation Age of Onset  . Healthy Father   . Healthy Mother   . Stroke Paternal Grandmother   . Heart attack Paternal Grandmother   . Diabetes Maternal Grandmother   . Cancer Neg Hx     Social History   Socioeconomic History  . Marital status: Married    Spouse name: Not on file  . Number of children: 2  . Years of education: Not on file  . Highest education level: Not on file  Occupational History  . Not on file  Social Needs  . Financial resource strain: Not on file  . Food insecurity:    Worry: Not on file    Inability: Not on file  . Transportation needs:    Medical: Not on file    Non-medical: Not on file  Tobacco Use  . Smoking status: Never Smoker  . Smokeless tobacco: Never Used  Substance and Sexual Activity  . Alcohol use: No    Comment: Occasional  . Drug use: No  . Sexual activity: Yes  Lifestyle  . Physical activity:    Days per week: Not on file    Minutes per session: Not on file  . Stress: Not on file  Relationships  . Social  connections:    Talks on phone: Not on file    Gets together: Not on file    Attends religious service: Not on file    Active member of club or organization: Not on file    Attends meetings of clubs or organizations: Not on file    Relationship status: Not on file  . Intimate partner violence:    Fear of current or ex partner: Not on file    Emotionally abused: Not on file    Physically abused: Not on file    Forced sexual activity: Not on file  Other Topics Concern  . Not on file  Social History Narrative  . Not on file    Outpatient Medications Prior to Visit  Medication Sig Dispense Refill  . ARIPiprazole (ABILIFY) 2 MG tablet Take 2 mg by mouth daily.     . cloNIDine (CATAPRES) 0.1 MG tablet Take 0.05 mg by mouth daily.     Marland Kitchen. doxycycline (VIBRA-TABS) 100 MG tablet Take by mouth.    . hydrochlorothiazide (MICROZIDE) 12.5 MG capsule TAKE ONE CAPSULE BY MOUTH DAILY 90 capsule 3  . medroxyPROGESTERone (PROVERA) 10 MG  tablet Take 1 tablet (10 mg total) by mouth daily. (Patient not taking: Reported on 06/14/2018) 10 tablet 0   No facility-administered medications prior to visit.       ROS:  Review of Systems  Constitutional: Negative for fever.  Gastrointestinal: Negative for blood in stool, constipation, diarrhea, nausea and vomiting.  Genitourinary: Positive for vaginal discharge. Negative for dyspareunia, dysuria, flank pain, frequency, hematuria, urgency, vaginal bleeding and vaginal pain.  Musculoskeletal: Negative for back pain.  Skin: Negative for rash.   BREAST: No symptoms   OBJECTIVE:   Vitals:  BP 128/82 (BP Location: Left Arm, Patient Position: Sitting, Cuff Size: Large)   Pulse 92   Ht 5' 2.75" (1.594 m)   Wt 221 lb 8 oz (100.5 kg)   LMP 05/27/2018   SpO2 98%   BMI 39.55 kg/m   Physical Exam  Constitutional: She is oriented to person, place, and time. Vital signs are normal. She appears well-developed.  Pulmonary/Chest: Effort normal.    Genitourinary: Uterus normal. There is no rash, tenderness or lesion on the right labia. There is no rash, tenderness or lesion on the left labia. Uterus is not enlarged and not tender. Cervix exhibits no motion tenderness. Right adnexum displays no mass and no tenderness. Left adnexum displays no mass and no tenderness. No erythema or tenderness in the vagina. Vaginal discharge found.  Genitourinary Comments: THIN, MUCOUS D/C  Musculoskeletal: Normal range of motion.  Neurological: She is alert and oriented to person, place, and time.  Psychiatric: She has a normal mood and affect. Her behavior is normal. Thought content normal.  Vitals reviewed.   Results: Results for orders placed or performed in visit on 06/14/18 (from the past 24 hour(s))  POCT Wet Prep with KOH     Status: Normal   Collection Time: 06/14/18 11:43 AM  Result Value Ref Range   Trichomonas, UA Negative    Clue Cells Wet Prep HPF POC NEG    Epithelial Wet Prep HPF POC  Few, Moderate, Many, Too numerous to count   Yeast Wet Prep HPF POC NEG    Bacteria Wet Prep HPF POC  Few   RBC Wet Prep HPF POC     WBC Wet Prep HPF POC     KOH Prep POC Negative Negative     Assessment/Plan: Vaginal discharge - Neg wet prep/exam. Reassurance. F/u prn. If develops itch on abx, can treat with monistat or Rx diflucan.  - Plan: POCT Wet Prep with KOH    Return if symptoms worsen or fail to improve.  Chyane Greer B. Avant Printy, PA-C 06/14/2018 11:45 AM

## 2018-06-20 ENCOUNTER — Encounter: Payer: Self-pay | Admitting: Obstetrics and Gynecology

## 2018-06-22 ENCOUNTER — Other Ambulatory Visit: Payer: Self-pay | Admitting: Obstetrics & Gynecology

## 2018-06-22 ENCOUNTER — Telehealth: Payer: Self-pay | Admitting: Obstetrics and Gynecology

## 2018-06-22 DIAGNOSIS — N912 Amenorrhea, unspecified: Secondary | ICD-10-CM

## 2018-06-22 NOTE — Telephone Encounter (Signed)
Please advise 

## 2018-06-22 NOTE — Telephone Encounter (Signed)
Patient has had a positive pregnancy test at home.  She has had a history of them and hasn't been pregnant.  She wants to make sure.  Can we order the lab test so she knows for sure?

## 2018-06-22 NOTE — Telephone Encounter (Signed)
Lab ordered, for her to come by and have done

## 2018-06-22 NOTE — Telephone Encounter (Signed)
Pt aware.

## 2018-06-23 ENCOUNTER — Other Ambulatory Visit (INDEPENDENT_AMBULATORY_CARE_PROVIDER_SITE_OTHER): Payer: BLUE CROSS/BLUE SHIELD

## 2018-06-23 DIAGNOSIS — N912 Amenorrhea, unspecified: Secondary | ICD-10-CM

## 2018-06-24 LAB — BETA HCG QUANT (REF LAB): hCG Quant: <1 m[IU]/mL

## 2018-06-24 NOTE — Progress Notes (Signed)
Let her know pregnancy lab test negative

## 2018-06-24 NOTE — Progress Notes (Signed)
Left message to return call if any questions.

## 2018-07-14 ENCOUNTER — Ambulatory Visit (INDEPENDENT_AMBULATORY_CARE_PROVIDER_SITE_OTHER): Payer: BLUE CROSS/BLUE SHIELD | Admitting: Obstetrics and Gynecology

## 2018-07-14 ENCOUNTER — Encounter: Payer: Self-pay | Admitting: Obstetrics and Gynecology

## 2018-07-14 VITALS — BP 126/81 | HR 81 | Ht 62.75 in | Wt 219.4 lb

## 2018-07-14 DIAGNOSIS — N926 Irregular menstruation, unspecified: Secondary | ICD-10-CM

## 2018-07-14 DIAGNOSIS — Z3202 Encounter for pregnancy test, result negative: Secondary | ICD-10-CM | POA: Diagnosis not present

## 2018-07-14 DIAGNOSIS — N644 Mastodynia: Secondary | ICD-10-CM | POA: Diagnosis not present

## 2018-07-14 LAB — POCT URINE PREGNANCY: Preg Test, Ur: NEGATIVE

## 2018-07-14 NOTE — Progress Notes (Signed)
GYNECOLOGY PROGRESS NOTE  Subjective:    Patient ID: Caroline Fletcher, female    DOB: Nov 04, 1986, 32 y.o.   MRN: [id number]  HPI  Patient is a 32 y.o. 233P2012 female who presents for confirmation of pregnancy.  Patient notes that she and her partner have actively been trying.  She has a history of irregular menstrual cycles.  Most recently she has noticed some spotting but has not had a cycle.  Her last menstrual period was July 5.  She reports taking 2 pregnancy tests at home this past Tuesday, with one test being positive and one test being negative.  She is also been noting having some "pregnancy symptoms", including breast soreness.   Of note, on review of patient's chart [facility] as recently as within the past month.  She has been taking Provera to induce menstrual cycles secondary to history of oligomenorrhea.  Recent BhCG on 06/23/2018 was negative.    OB History  Gravida Para Term Preterm AB Living  3 2 2   1 2   SAB TAB Ectopic Multiple Live Births  1     0 2    # Outcome Date GA Lbr Len/2nd Weight Sex Delivery Anes PTL Lv  3 Term 08/23/16 622w3d / 01:42 7 lb 2.6 oz (3.25 kg) F Vag-Spont EPI  LIV  2 Term 10/31/14   7 lb 5 oz (3.317 kg)  Vag-Spont   LIV  1 SAB      SAB       Past Medical History:  Diagnosis Date  . Asthma, chronic 06/15/2013  . Cleft palate   . History of abnormal cervical Pap smear 2014   LGSIL with positive HRHPV  . 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 Hypertension   . Tourette's disorder    Dr. Zola ButtonHaq   Past Surgical History:  Procedure Laterality Date  . COLPOSCOPY  2009  . MYRINGOTOMY     bilateral  . Pallet repair  1990    Family History  Problem Relation Age of Onset  . Healthy Father   . Healthy Mother   . Stroke Paternal Grandmother   . Heart attack Paternal Grandmother   . Diabetes Maternal Grandmother   . Cancer Neg Hx     Social History    Socioeconomic History  . Marital status: Married    Spouse name: Not on file  . Number of children: 2  . Years of education: Not on file  . Highest education level: Not on file  Occupational History  . Not on file  Social Needs  . Financial resource strain: Not on file  . Food insecurity:    Worry: Not on file    Inability: Not on file  . Transportation needs:    Medical: Not on file    Non-medical: Not on file  Tobacco Use  . Smoking status: Never Smoker  . Smokeless tobacco: Never Used  Substance and Sexual Activity  . Alcohol use: No    Comment: Occasional  . Drug use: No  . Sexual activity: Yes    Birth control/protection: None, Condom  Lifestyle  . Physical activity:    Days per week: Not on file    Minutes per session: Not on file  . Stress: Not on file  Relationships  . Social connections:    Talks on phone: Not on file    Gets together: Not on file  Attends religious service: Not on file    Active member of club or organization: Not on file    Attends meetings of clubs or organizations: Not on file    Relationship status: Not on file  . Intimate partner violence:    Fear of current or ex partner: Not on file    Emotionally abused: Not on file    Physically abused: Not on file    Forced sexual activity: Not on file  Other Topics Concern  . Not on file  Social History Narrative  . Not on file    Current Outpatient Medications on File Prior to Visit  Medication Sig Dispense Refill  . ARIPiprazole (ABILIFY) 2 MG tablet Take 2 mg by mouth daily.     . cloNIDine (CATAPRES) 0.1 MG tablet Take 0.05 mg by mouth daily.     . hydrochlorothiazide (MICROZIDE) 12.5 MG capsule TAKE ONE CAPSULE BY MOUTH DAILY 90 capsule 3   No current facility-administered medications on file prior to visit.     No Known Allergies   Review of Systems Pertinent items noted in HPI and remainder of comprehensive ROS otherwise negative.   Objective:   Blood pressure 126/81,  pulse 81, height 5' 2.75" (1.594 m), weight 219 lb 6.4 oz (99.5 kg), last menstrual period 05/27/2018, not currently breastfeeding. General appearance: alert and no distress Remainder of exam negative.    Labs:  Results for orders placed or performed in visit on 07/14/18  POCT urine pregnancy  Result Value Ref Range   Preg Test, Ur Negative Negative    Assessment:   Pregnancy symptoms (breast soreness) Amenorrhea  Plan:   1.  Amenorrhea with pregnancy since.  UPT negative in office today.  Patient reports history of both positive and negative testing several days ago.  She also reports that during her last pregnancy her initial pregnancy test was negative, however once repeated several weeks later she was determined to have been at least [redacted] weeks pregnant.  Patient desires to her hCG hormone levels repeated, will order today.  If results continue to be negative can reassure the patient is not currently pregnant and that symptoms may be secondary to her current use of Provera.   Hildred Laser, MD [facility]

## 2018-07-14 NOTE — Progress Notes (Signed)
Pt stated that she had some spotting but not a cycle. Pt stated that she is having some pregnancy symptoms. Pt did a pregnancy test at home and it was positive. UPT done in office and it was negative.

## 2018-07-15 ENCOUNTER — Telehealth: Payer: Self-pay | Admitting: Obstetrics and Gynecology

## 2018-07-15 LAB — BETA HCG QUANT (REF LAB)

## 2018-07-15 NOTE — Telephone Encounter (Signed)
Pt called the office looking for her test results. Pt was informed that her test results were visible but had to [facility] before the results could be given to her. Pt was advised to keep an eye on her mychart because [facility] she would release them to her mychart. Pt stated that she has an appointment on Monday and would be in the office then.

## 2018-07-15 NOTE — Telephone Encounter (Signed)
The patient called and wanted a nurse to call her back in regards to receiving her test results. No other information was disclosed. Please advise.

## 2018-07-15 NOTE — Telephone Encounter (Signed)
Pt was called no answer and was unable to leave a message due to mailbox being full.

## 2018-07-17 ENCOUNTER — Encounter: Payer: Self-pay | Admitting: Obstetrics and Gynecology

## 2018-07-18 ENCOUNTER — Telehealth: Payer: Self-pay | Admitting: Obstetrics and Gynecology

## 2018-07-18 ENCOUNTER — Other Ambulatory Visit: Payer: BLUE CROSS/BLUE SHIELD

## 2018-07-18 DIAGNOSIS — N926 Irregular menstruation, unspecified: Secondary | ICD-10-CM | POA: Diagnosis not present

## 2018-07-18 NOTE — Telephone Encounter (Signed)
Th patient called and stated that she was seen in the office today and that she has not received a call from the nurse with her results yet, The patient is wanting a call back from a nurse sometime today. Please advise.

## 2018-07-18 NOTE — Telephone Encounter (Signed)
Pt was called and informed that her beta was negative.

## 2018-07-19 LAB — BETA HCG QUANT (REF LAB)

## 2018-07-22 ENCOUNTER — Ambulatory Visit (INDEPENDENT_AMBULATORY_CARE_PROVIDER_SITE_OTHER): Payer: Medicaid Other | Admitting: Obstetrics and Gynecology

## 2018-07-22 ENCOUNTER — Encounter: Payer: Self-pay | Admitting: Obstetrics and Gynecology

## 2018-07-22 ENCOUNTER — Other Ambulatory Visit (INDEPENDENT_AMBULATORY_CARE_PROVIDER_SITE_OTHER): Payer: Medicaid Other

## 2018-07-22 VITALS — BP 131/81 | HR 94 | Ht 62.0 in | Wt 219.4 lb

## 2018-07-22 DIAGNOSIS — N939 Abnormal uterine and vaginal bleeding, unspecified: Secondary | ICD-10-CM | POA: Diagnosis not present

## 2018-07-22 DIAGNOSIS — N83202 Unspecified ovarian cyst, left side: Secondary | ICD-10-CM | POA: Diagnosis not present

## 2018-07-22 DIAGNOSIS — N926 Irregular menstruation, unspecified: Secondary | ICD-10-CM | POA: Diagnosis not present

## 2018-07-22 NOTE — Progress Notes (Signed)
Pt is present today for test results.  

## 2018-07-22 NOTE — Progress Notes (Signed)
    GYNECOLOGY PROGRESS NOTE  Subjective:    Patient ID: Caroline Fletcher, female    DOB: June 04, 1986, 32 y.o.   MRN: [id number]  HPI  Patient is a 32 y.o. 303P2012 female with missed menses who presents for follow up discussion of test results.  Patient reported a positive home UPT, but since has had several negative results in the office (negative UPT x 1 and 2 BHCG levels that were negative).  She does report a h/o irregular menses this year, with several skipped periods in the spring, performed Provera challenge x 1 which regulated her cycle until July where she skipped another cycle. Patient concerned with missed menses and negative lab results, and she continues to consider the possibility of inconclusive results as this is how her last pregnancy presented with negative tests until 6 weeks of pregnancy.  Desires an ultrasound today.   The following portions of the patient's history were reviewed and updated as appropriate: allergies, current medications, past family history, past medical history, past social history, past surgical history and problem list.  Review of Systems Pertinent items noted in HPI and remainder of comprehensive ROS otherwise negative.   Objective:   Blood pressure 131/81, pulse 94, height 5\' 2"  (1.575 m), weight 219 lb 6.4 oz (99.5 kg), not currently breastfeeding. General appearance: alert and no distress Remainder of exam deferred.    Assessment:   Missed menses H/o irregular cycles  Plan:   - Discussed results with patient again today, advised that an ultrasound was not necessary as she has had multiple negative tests, and that her hormones were likely out of sync again. Offered to check hormonal levels, however patient still requests an ultrasound just to be sure that "nothing else is going on".  Patient notes that if her ultrasound is negative, she will then proceed with taking her Provera again to induce a cycle.  Notes that she is currently trying to  conceive. Discussed use of Provera if no cycles after 35 days, and usually after 2 rounds most cycles resume normally spontaneously.    Hildred Laserherry, Shadie Sweatman, MD [facility]

## 2018-07-24 ENCOUNTER — Encounter: Payer: Self-pay | Admitting: Obstetrics and Gynecology

## 2018-08-03 ENCOUNTER — Ambulatory Visit: Payer: BLUE CROSS/BLUE SHIELD | Admitting: Obstetrics and Gynecology

## 2018-08-08 ENCOUNTER — Ambulatory Visit: Payer: Medicaid Other | Admitting: Obstetrics and Gynecology

## 2018-09-26 ENCOUNTER — Ambulatory Visit (INDEPENDENT_AMBULATORY_CARE_PROVIDER_SITE_OTHER): Payer: Medicaid Other | Admitting: Advanced Practice Midwife

## 2018-09-26 ENCOUNTER — Other Ambulatory Visit (HOSPITAL_COMMUNITY)
Admission: RE | Admit: 2018-09-26 | Discharge: 2018-09-26 | Disposition: A | Discharge status: Home / Self Care | Payer: Medicaid Other | Source: Ambulatory Visit | Attending: Advanced Practice Midwife | Admitting: Advanced Practice Midwife

## 2018-09-26 ENCOUNTER — Encounter: Payer: Self-pay | Admitting: Advanced Practice Midwife

## 2018-09-26 VITALS — BP 124/78 | Wt 222.0 lb

## 2018-09-26 DIAGNOSIS — Z124 Encounter for screening for malignant neoplasm of cervix: Secondary | ICD-10-CM

## 2018-09-26 DIAGNOSIS — Z0000 Encounter for general adult medical examination without abnormal findings: Secondary | ICD-10-CM | POA: Diagnosis not present

## 2018-09-26 DIAGNOSIS — Z01419 Encounter for gynecological examination (general) (routine) without abnormal findings: Secondary | ICD-10-CM | POA: Insufficient documentation

## 2018-09-26 DIAGNOSIS — N912 Amenorrhea, unspecified: Secondary | ICD-10-CM

## 2018-09-26 NOTE — Progress Notes (Signed)
NOB today. Not sure on LMP

## 2018-09-26 NOTE — Progress Notes (Signed)
Patient ID: Caroline Fletcher, female   DOB: 26-Aug-1986, 32 y.o.   MRN: [id number]     Gynecology Annual Exam   PCP: Hannah Beat, MD  Chief Complaint:  Chief Complaint  Patient presents with  . Routine Prenatal Visit    History of Present Illness: Patient is a 32 y.o. [id number] presents for annual exam. The patient has complaint today of increased clear vaginal discharge. She has been trying to get pregnant and today came in with 2 faint positive tests that she brought from home. She has been under the care of Dr Jean Rosenthal regarding fertility issues. The test here today is negative. We will change the visit to an Annual instead of a NOB and we will do some labs today including HCG, and other routine blood work.   LMP: Patient's last menstrual period was 05/27/2018. Average Interval: regular, every 2 months  Duration of flow: 5 days Heavy Menses: yes Clots: no Intermenstrual Bleeding: no Postcoital Bleeding: no Dysmenorrhea: no  The patient is sexually active. She currently uses none for contraception. She denies dyspareunia.  The patient does perform self breast exams.  There is no notable family history of breast or ovarian cancer in her family.  The patient wears seatbelts: yes.   The patient has regular exercise: she admits walking and being active with her children. She denies regular cardio activity. She has been trying to eat healthy foods and to drink more water instead of soda and juice.    The patient denies current symptoms of depression.    Review of Systems: Review of Systems  Constitutional: Negative.        Fatigue   HENT:       Nasal congestion  Eyes: Negative.   Respiratory: Negative.   Cardiovascular: Negative.   Gastrointestinal: Negative.   Genitourinary:       Clear vaginal discharge, frequent urination  Musculoskeletal: Negative.   Skin: Negative.   Neurological: Negative.   Endo/Heme/Allergies: Negative.   Psychiatric/Behavioral: Negative.     Past  Medical History:  Past Medical History:  Diagnosis Date  . Asthma, chronic 06/15/2013  . Cleft palate   . History of abnormal cervical Pap smear 2014   LGSIL with positive HRHPV  . 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 Hypertension   . Tourette's disorder    Dr. Zola Button    Past Surgical History:  Past Surgical History:  Procedure Laterality Date  . COLPOSCOPY  2009  . MYRINGOTOMY     bilateral  . Pallet repair  1990    Gynecologic History:  Patient's last menstrual period was 05/27/2018. Contraception: none Last Pap: 3 years ago Results were: no abnormalities   Obstetric History: [id number]  Family History:  Family History  Problem Relation Age of Onset  . Healthy Father   . Healthy Mother   . Stroke Paternal Grandmother   . Heart attack Paternal Grandmother   . Diabetes Maternal Grandmother   . Cancer Neg Hx     Social History:  Social History   Socioeconomic History  . Marital status: Married    Spouse name: Not on file  . Number of children: 2  . Years of education: Not on file  . Highest education level: Not on file  Occupational History  . Not on file  Social Needs  . Financial resource strain: Not on file  . Food insecurity:    Worry: Not on file    Inability: Not on  file  . Transportation needs:    Medical: Not on file    Non-medical: Not on file  Tobacco Use  . Smoking status: Never Smoker  . Smokeless tobacco: Never Used  Substance and Sexual Activity  . Alcohol use: No    Comment: Occasional  . Drug use: No  . Sexual activity: Yes    Birth control/protection: None  Lifestyle  . Physical activity:    Days per week: Not on file    Minutes per session: Not on file  . Stress: Not on file  Relationships  . Social connections:    Talks on phone: Not on file    Gets together: Not on file    Attends religious service: Not on file    Active member of club or organization: Not on file    Attends  meetings of clubs or organizations: Not on file    Relationship status: Not on file  . Intimate partner violence:    Fear of current or ex partner: Not on file    Emotionally abused: Not on file    Physically abused: Not on file    Forced sexual activity: Not on file  Other Topics Concern  . Not on file  Social History Narrative  . Not on file    Allergies:  No Known Allergies  Medications: Prior to Admission medications   Medication Sig Start Date End Date Taking? Authorizing Provider  ARIPiprazole (ABILIFY) 2 MG tablet Take 2 mg by mouth daily.    Yes [provider]  cloNIDine (CATAPRES) 0.1 MG tablet Take 0.05 mg by mouth daily.    Yes [provider]  hydrochlorothiazide (MICROZIDE) 12.5 MG capsule TAKE ONE CAPSULE BY MOUTH DAILY 03/16/18  Yes Copland, Karleen Hampshire, MD    Physical Exam Vitals: Blood pressure 124/78, weight 222 lb (100.7 kg), last menstrual period 05/27/2018  General: NAD HEENT: normocephalic, anicteric Thyroid: no enlargement, no palpable nodules Pulmonary: No increased work of breathing, CTAB Cardiovascular: RRR, distal pulses 2+ Breast: Breast symmetrical, no tenderness, no palpable nodules or masses, no skin or nipple retraction present, no nipple discharge.  No axillary or supraclavicular lymphadenopathy. Abdomen: NABS, soft, non-tender, non-distended.  Umbilicus without lesions.  No hepatomegaly, splenomegaly or masses palpable. No evidence of hernia  Genitourinary:  External: Normal external female genitalia.  Normal urethral meatus, normal Bartholin's and Skene's glands.    Vagina: Normal vaginal mucosa, no evidence of prolapse.    Cervix: Grossly normal in appearance, no bleeding, no CMT  Uterus: deferred for no concerns/shared decision making    Adnexa: deferred for no concerns/shared decision making  Rectal: deferred  Lymphatic: no evidence of inguinal lymphadenopathy Extremities: no edema, erythema, or tenderness Neurologic:  Grossly intact Psychiatric: mood appropriate, affect ful   Assessment: 32 y.o. [id number] routine annual exam  Plan: Problem List Items Addressed This Visit    None    Visit Diagnoses    Well woman exam with routine gynecological exam    -  Primary   Relevant Orders   Cytology - PAP   Cervical cancer screening       Relevant Orders   Cytology - PAP   Blood tests for routine general physical examination       Relevant Orders   Thyroid Panel With TSH   VITAMIN D 25 Hydroxy (Vit-D Deficiency, Fractures)   Hgb A1c w/o eAG   Lipid Panel With LDL/HDL Ratio   Comprehensive metabolic panel   CBC   Amenorrhea  Relevant Orders   Beta hCG quant (ref lab)      1) STI screening  was offered and declined  2)  ASCCP guidelines and rational discussed.  Patient opts for every 3 years screening interval  3) Contraception - the patient is currently using  none.  She is attempting to conceive in the near future  4) Routine healthcare maintenance including cholesterol, diabetes screening discussed Ordered today  5) Return in about 1 year (around 09/27/2019) for annual.   Tresea Mall, CNM [facility], [facility] 09/26/2018, 10:42 AM

## 2018-09-26 NOTE — Patient Instructions (Signed)
Mediterranean Diet A Mediterranean diet refers to food and lifestyle choices that are based on the traditions of countries located on the [place]. This way of eating has been shown to help prevent certain conditions and improve outcomes for people who have chronic diseases, like kidney disease and heart disease. What are tips for following this plan? Lifestyle  Cook and eat meals together with your family, when possible.  Drink enough fluid to keep your urine clear or pale yellow.  Be physically active every day. This includes: ? Aerobic exercise like running or swimming. ? Leisure activities like gardening, walking, or housework.  Get 7-8 hours of sleep each night.  If recommended by your health care provider, drink red wine in moderation. This means 1 glass a day for nonpregnant women and 2 glasses a day for men. A glass of wine equals 5 oz (150 mL). Reading food labels  Check the serving size of packaged foods. For foods such as rice and pasta, the serving size refers to the amount of cooked product, not dry.  Check the total fat in packaged foods. Avoid foods that have saturated fat or trans fats.  Check the ingredients list for added sugars, such as corn syrup. Shopping  At the grocery store, buy most of your food from the areas near the walls of the store. This includes: ? Fresh fruits and vegetables (produce). ? Grains, beans, nuts, and seeds. Some of these may be available in unpackaged forms or large amounts (in bulk). ? Fresh seafood. ? Poultry and eggs. ? Low-fat dairy products.  Buy whole ingredients instead of prepackaged foods.  Buy fresh fruits and vegetables in-season from local farmers markets.  Buy frozen fruits and vegetables in resealable bags.  If you do not have access to quality fresh seafood, buy precooked frozen shrimp or canned fish, such as tuna, salmon, or sardines.  Buy small amounts of raw or cooked vegetables, salads, or olives from the  deli or salad bar at your store.  Stock your pantry so you always have certain foods on hand, such as olive oil, canned tuna, canned tomatoes, rice, pasta, and beans. Cooking  Cook foods with extra-virgin olive oil instead of using butter or other vegetable oils.  Have meat as a side dish, and have vegetables or grains as your main dish. This means having meat in small portions or adding small amounts of meat to foods like pasta or stew.  Use beans or vegetables instead of meat in common dishes like chili or lasagna.  Experiment with different cooking methods. Try roasting or broiling vegetables instead of steaming or sauteing them.  Add frozen vegetables to soups, stews, pasta, or rice.  Add nuts or seeds for added healthy fat at each meal. You can add these to yogurt, salads, or vegetable dishes.  Marinate fish or vegetables using olive oil, lemon juice, garlic, and fresh herbs. Meal planning  Plan to eat 1 vegetarian meal one day each week. Try to work up to 2 vegetarian meals, if possible.  Eat seafood 2 or more times a week.  Have healthy snacks readily available, such as: ? Vegetable sticks with hummus. ? Greek yogurt. ? Fruit and nut trail mix.  Eat balanced meals throughout the week. This includes: ? Fruit: 2-3 servings a day ? Vegetables: 4-5 servings a day ? Low-fat dairy: 2 servings a day ? Fish, poultry, or lean meat: 1 serving a day ? Beans and legumes: 2 or more servings a week ? Nuts   and seeds: 1-2 servings a day ? Whole grains: 6-8 servings a day ? Extra-virgin olive oil: 3-4 servings a day  Limit red meat and sweets to only a few servings a month What are my food choices?  Mediterranean diet ? Recommended ? Grains: Whole-grain pasta. Brown rice. Bulgar wheat. Polenta. Couscous. Whole-wheat bread. Oatmeal. Quinoa. ? Vegetables: Artichokes. Beets. Broccoli. Cabbage. Carrots. Eggplant. Green beans. Chard. Kale. Spinach. Onions. Leeks. Peas. Squash.  Tomatoes. Peppers. Radishes. ? Fruits: Apples. Apricots. Avocado. Berries. Bananas. Cherries. Dates. Figs. Grapes. Lemons. Melon. Oranges. Peaches. Plums. Pomegranate. ? Meats and other protein foods: Beans. Almonds. Sunflower seeds. Pine nuts. Peanuts. Cod. Salmon. Scallops. Shrimp. Tuna. Tilapia. Clams. Oysters. Eggs. ? Dairy: Low-fat milk. Cheese. Greek yogurt. ? Beverages: Water. Red wine. Herbal tea. ? Fats and oils: Extra virgin olive oil. Avocado oil. Grape seed oil. ? Sweets and desserts: Greek yogurt with honey. Baked apples. Poached pears. Trail mix. ? Seasoning and other foods: Basil. Cilantro. Coriander. Cumin. Mint. Parsley. Sage. Rosemary. Tarragon. Garlic. Oregano. Thyme. Pepper. Balsalmic vinegar. Tahini. Hummus. Tomato sauce. Olives. Mushrooms. ? Limit these ? Grains: Prepackaged pasta or rice dishes. Prepackaged cereal with added sugar. ? Vegetables: Deep fried potatoes (french fries). ? Fruits: Fruit canned in syrup. ? Meats and other protein foods: Beef. Pork. Lamb. Poultry with skin. Hot dogs. Bacon. ? Dairy: Ice cream. Sour cream. Whole milk. ? Beverages: Juice. Sugar-sweetened soft drinks. Beer. Liquor and spirits. ? Fats and oils: Butter. Canola oil. Vegetable oil. Beef fat (tallow). Lard. ? Sweets and desserts: Cookies. Cakes. Pies. Candy. ? Seasoning and other foods: Mayonnaise. Premade sauces and marinades. ? The items listed may not be a complete list. Talk with your dietitian about what dietary choices are right for you. Summary  The Mediterranean diet includes both food and lifestyle choices.  Eat a variety of fresh fruits and vegetables, beans, nuts, seeds, and whole grains.  Limit the amount of red meat and sweets that you eat.  Talk with your health care provider about whether it is safe for you to drink red wine in moderation. This means 1 glass a day for nonpregnant women and 2 glasses a day for men. A glass of wine equals 5 oz (150 mL). This information  is not intended to replace advice given to you by your health care provider. Make sure you discuss any questions you have with your health care provider. Document Released: 07/02/2016 Document Revised: 08/04/2016 Document Reviewed: 07/02/2016 Elsevier Interactive Patient Education  2018 Elsevier Inc. American Heart Association (AHA) Exercise Recommendation  Being physically active is important to prevent heart disease and stroke, the nation's No. 1and No. 5killers. To improve overall cardiovascular health, we suggest at least 150 minutes per week of moderate exercise or 75 minutes per week of vigorous exercise (or a combination of moderate and vigorous activity). Thirty minutes a day, five times a week is an easy goal to remember. You will also experience benefits even if you divide your time into two or three segments of 10 to 15 minutes per day.  For people who would benefit from lowering their blood pressure or cholesterol, we recommend 40 minutes of aerobic exercise of moderate to vigorous intensity three to four times a week to lower the risk for heart attack and stroke.  Physical activity is anything that makes you move your body and burn calories.  This includes things like climbing stairs or playing sports. Aerobic exercises benefit your heart, and include walking, jogging, swimming or biking. Strength and   stretching exercises are best for overall stamina and flexibility.  The simplest, positive change you can make to effectively improve your heart health is to start walking. It's enjoyable, free, easy, social and great exercise. A walking program is flexible and boasts high success rates because people can stick with it. It's easy for walking to become a regular and satisfying part of life.   For Overall Cardiovascular Health:  At least 30 minutes of moderate-intensity aerobic activity at least 5 days per week for a total of 150  OR   At least 25 minutes of vigorous aerobic activity  at least 3 days per week for a total of 75 minutes; or a combination of moderate- and vigorous-intensity aerobic activity  AND   Moderate- to high-intensity muscle-strengthening activity at least 2 days per week for additional health benefits.  For Lowering Blood Pressure and Cholesterol  An average 40 minutes of moderate- to vigorous-intensity aerobic activity 3 or 4 times per week  What if I can't make it to the time goal? Something is always better than nothing! And everyone has to start somewhere. Even if you've been sedentary for years, today is the day you can begin to make healthy changes in your life. If you don't think you'll make it for 30 or 40 minutes, set a reachable goal for today. You can work up toward your overall goal by increasing your time as you get stronger. Don't let all-or-nothing thinking rob you of doing what you can every day.  Source:http://www.heart.org   Preventive Care 18-39 Years, Female Preventive care refers to lifestyle choices and visits with your health care provider that can promote health and wellness. What does preventive care include?  A yearly physical exam. This is also called an annual well check.  Dental exams once or twice a year.  Routine eye exams. Ask your health care provider how often you should have your eyes checked.  Personal lifestyle choices, including: ? Daily care of your teeth and gums. ? Regular physical activity. ? Eating a healthy diet. ? Avoiding tobacco and drug use. ? Limiting alcohol use. ? Practicing safe sex. ? Taking vitamin and mineral supplements as recommended by your health care provider. What happens during an annual well check? The services and screenings done by your health care provider during your annual well check will depend on your age, overall health, lifestyle risk factors, and family history of disease. Counseling Your health care provider may ask you questions about your:  Alcohol  use.  Tobacco use.  Drug use.  Emotional well-being.  Home and relationship well-being.  Sexual activity.  Eating habits.  Work and work Statistician.  Method of birth control.  Menstrual cycle.  Pregnancy history.  Screening You may have the following tests or measurements:  Height, weight, and BMI.  Diabetes screening. This is done by checking your blood sugar (glucose) after you have not eaten for a while (fasting).  Blood pressure.  Lipid and cholesterol levels. These may be checked every 5 years starting at age 78.  Skin check.  Hepatitis C blood test.  Hepatitis B blood test.  Sexually transmitted disease (STD) testing.  BRCA-related cancer screening. This may be done if you have a family history of breast, ovarian, tubal, or peritoneal cancers.  Pelvic exam and Pap test. This may be done every 3 years starting at age 82. Starting at age 3, this may be done every 5 years if you have a Pap test in combination with an HPV  test.  Discuss your test results, treatment options, and if necessary, the need for more tests with your health care provider. Vaccines Your health care provider may recommend certain vaccines, such as:  Influenza vaccine. This is recommended every year.  Tetanus, diphtheria, and acellular pertussis (Tdap, Td) vaccine. You may need a Td booster every 10 years.  Varicella vaccine. You may need this if you have not been vaccinated.  HPV vaccine. If you are 48 or younger, you may need three doses over 6 months.  Measles, mumps, and rubella (MMR) vaccine. You may need at least one dose of MMR. You may also need a second dose.  Pneumococcal 13-valent conjugate (PCV13) vaccine. You may need this if you have certain conditions and were not previously vaccinated.  Pneumococcal polysaccharide (PPSV23) vaccine. You may need one or two doses if you smoke cigarettes or if you have certain conditions.  Meningococcal vaccine. One dose is  recommended if you are age 36-21 years and a first-year college student living in a residence hall, or if you have one of several medical conditions. You may also need additional booster doses.  Hepatitis A vaccine. You may need this if you have certain conditions or if you travel or work in places where you may be exposed to hepatitis A.  Hepatitis B vaccine. You may need this if you have certain conditions or if you travel or work in places where you may be exposed to hepatitis B.  Haemophilus influenzae type b (Hib) vaccine. You may need this if you have certain risk factors.  Talk to your health care provider about which screenings and vaccines you need and how often you need them. This information is not intended to replace advice given to you by your health care provider. Make sure you discuss any questions you have with your health care provider. Document Released: 01/05/2002 Document Revised: 07/29/2016 Document Reviewed: 09/10/2015 Elsevier Interactive Patient Education  [employer].

## 2018-09-27 LAB — CBC
HEMOGLOBIN: 12.6 g/dL (ref 11.1–15.9)
Hematocrit: 37.9 % (ref 34.0–46.6)
MCH: 27.3 pg (ref 26.6–33.0)
MCHC: 33.2 g/dL (ref 31.5–35.7)
MCV: 82 fL (ref 79–97)
PLATELETS: 263 10*3/uL (ref 150–450)
RBC: 4.61 x10E6/uL (ref 3.77–5.28)
RDW: 13.1 % (ref 12.3–15.4)
WBC: 7.4 10*3/uL (ref 3.4–10.8)

## 2018-09-27 LAB — COMPREHENSIVE METABOLIC PANEL
A/G RATIO: 1.8 (ref 1.2–2.2)
ALK PHOS: 58 IU/L (ref 39–117)
ALT: 33 IU/L — AB (ref 0–32)
AST: 27 IU/L (ref 0–40)
Albumin: 4.2 g/dL (ref 3.5–5.5)
BUN/Creatinine Ratio: 11 (ref 9–23)
BUN: 9 mg/dL (ref 6–20)
CHLORIDE: 98 mmol/L (ref 96–106)
CO2: 26 mmol/L (ref 20–29)
Calcium: 9.2 mg/dL (ref 8.7–10.2)
Creatinine, Ser: 0.85 mg/dL (ref 0.57–1.00)
GFR calc Af Amer: 106 mL/min/{1.73_m2} (ref >59)
GFR calc non Af Amer: 92 mL/min/{1.73_m2} (ref >59)
Globulin, Total: 2.3 g/dL (ref 1.5–4.5)
Glucose: 91 mg/dL (ref 65–99)
POTASSIUM: 3.8 mmol/L (ref 3.5–5.2)
SODIUM: 140 mmol/L (ref 134–144)
Total Protein: 6.5 g/dL (ref 6.0–8.5)

## 2018-09-27 LAB — LIPID PANEL WITH LDL/HDL RATIO
Cholesterol, Total: 185 mg/dL (ref 100–199)
HDL: 33 mg/dL — AB (ref >39)
LDL Calculated: 97 mg/dL (ref 0–99)
LDL/HDL RATIO: 2.9 ratio (ref 0.0–3.2)
TRIGLYCERIDES: 277 mg/dL — AB (ref 0–149)
VLDL CHOLESTEROL CAL: 55 mg/dL — AB (ref 5–40)

## 2018-09-27 LAB — HGB A1C W/O EAG: Hgb A1c MFr Bld: 5.5 % (ref 4.8–5.6)

## 2018-09-27 LAB — VITAMIN D 25 HYDROXY (VIT D DEFICIENCY, FRACTURES): Vit D, 25-Hydroxy: 28.6 ng/mL — ABNORMAL LOW (ref 30.0–100.0)

## 2018-09-27 LAB — THYROID PANEL WITH TSH
FREE THYROXINE INDEX: 2 (ref 1.2–4.9)
T3 UPTAKE RATIO: 24 % (ref 24–39)
T4 TOTAL: 8.2 ug/dL (ref 4.5–12.0)
TSH: 5.95 u[IU]/mL — AB (ref 0.450–4.500)

## 2018-09-27 LAB — BETA HCG QUANT (REF LAB)

## 2018-09-29 LAB — CYTOLOGY - PAP
DIAGNOSIS: NEGATIVE
HPV (WINDOPATH): NOT DETECTED

## 2018-09-30 ENCOUNTER — Telehealth: Payer: Self-pay | Admitting: Advanced Practice Midwife

## 2018-09-30 NOTE — Telephone Encounter (Signed)
Pt was seen by JEG

## 2018-09-30 NOTE — Telephone Encounter (Signed)
Please advise 

## 2018-09-30 NOTE — Telephone Encounter (Signed)
Please call patient with lab results from 11/4 visit.

## 2018-09-30 NOTE — Telephone Encounter (Signed)
Pt called nurse line for results as well.  [phone]

## 2018-09-30 NOTE — Telephone Encounter (Signed)
Reviewed results with patient. 

## 2018-10-24 ENCOUNTER — Other Ambulatory Visit: Payer: Self-pay | Admitting: Obstetrics and Gynecology

## 2018-10-24 DIAGNOSIS — N83202 Unspecified ovarian cyst, left side: Secondary | ICD-10-CM

## 2018-11-01 ENCOUNTER — Ambulatory Visit (INDEPENDENT_AMBULATORY_CARE_PROVIDER_SITE_OTHER): Payer: Medicaid Other | Admitting: Obstetrics and Gynecology

## 2018-11-01 ENCOUNTER — Ambulatory Visit (INDEPENDENT_AMBULATORY_CARE_PROVIDER_SITE_OTHER): Payer: Self-pay

## 2018-11-01 VITALS — BP 122/74 | Ht 62.0 in | Wt 222.0 lb

## 2018-11-01 DIAGNOSIS — N83202 Unspecified ovarian cyst, left side: Secondary | ICD-10-CM

## 2018-11-01 DIAGNOSIS — N914 Secondary oligomenorrhea: Secondary | ICD-10-CM

## 2018-11-01 MED ORDER — NORETHINDRONE ACETATE 5 MG PO TABS: 5.0000 mg | ORAL_TABLET | Freq: Every day | ORAL | 5 refills | Status: DC

## 2018-11-01 NOTE — Progress Notes (Signed)
Gynecology Ultrasound Follow Up   Chief Complaint  Patient presents with  . Follow-up  Oligomenorrhea, equivocal pregnancy tests at home.  History of Present Illness: Patient is a 32 y.o. female who presents today for ultrasound evaluation of the above.    She notes occasional cramping and spotting. No menses since July. Provera makes her extremely nauseated. So, she isn't taking it and hasn't in a while. Considering pregnancy sometime in the coming year.  Sore under left axilla. No lumps noted. Present x 2 years.    Ultrasound demonstrates the following findings Adnexa: no masses seen  Uterus: anteverted with endometrial stripe  11.5 mm Additional: no abnormalities noted  Past Medical History:  Diagnosis Date  . Asthma, chronic 06/15/2013  . Cleft palate   . History of abnormal cervical Pap smear 2014   LGSIL with positive HRHPV  . 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 Hypertension   . Tourette's disorder    Dr. Zola ButtonHaq    Past Surgical History:  Procedure Laterality Date  . COLPOSCOPY  2009  . MYRINGOTOMY     bilateral  . Pallet repair  1990    Family History  Problem Relation Age of Onset  . Healthy Father   . Healthy Mother   . Stroke Paternal Grandmother   . Heart attack Paternal Grandmother   . Diabetes Maternal Grandmother   . Cancer Neg Hx     Social History   Socioeconomic History  . Marital status: Married    Spouse name: Not on file  . Number of children: 2  . Years of education: Not on file  . Highest education level: Not on file  Occupational History  . Not on file  Social Needs  . Financial resource strain: Not on file  . Food insecurity:    Worry: Not on file    Inability: Not on file  . Transportation needs:    Medical: Not on file    Non-medical: Not on file  Tobacco Use  . Smoking status: Never Smoker  . Smokeless tobacco: Never Used  Substance and Sexual Activity  . Alcohol use: No   Comment: Occasional  . Drug use: No  . Sexual activity: Yes    Birth control/protection: None  Lifestyle  . Physical activity:    Days per week: Not on file    Minutes per session: Not on file  . Stress: Not on file  Relationships  . Social connections:    Talks on phone: Not on file    Gets together: Not on file    Attends religious service: Not on file    Active member of club or organization: Not on file    Attends meetings of clubs or organizations: Not on file    Relationship status: Not on file  . Intimate partner violence:    Fear of current or ex partner: Not on file    Emotionally abused: Not on file    Physically abused: Not on file    Forced sexual activity: Not on file  Other Topics Concern  . Not on file  Social History Narrative  . Not on file    No Known Allergies  Prior to Admission medications   Medication Sig Start Date End Date Taking? Authorizing Provider  ARIPiprazole (ABILIFY) 2 MG tablet Take 2 mg by mouth daily.    Yes [provider]  cloNIDine (CATAPRES) 0.1 MG tablet Take 0.05 mg by mouth  daily.    Yes [provider]  hydrochlorothiazide (MICROZIDE) 12.5 MG capsule TAKE ONE CAPSULE BY MOUTH DAILY 03/16/18  Yes Copland, Karleen Hampshire, MD    Physical Exam BP 122/74   Ht 5\' 2"  (1.575 m)   Wt 222 lb (100.7 kg)   BMI 40.60 kg/m    General: NAD HEENT: normocephalic, anicteric Pulmonary: No increased work of breathing Extremities: no edema, erythema, or tenderness Axilla: no lumps or abnormalities noted bilaterally Neurologic: Grossly intact, normal gait Psychiatric: mood appropriate, affect full   Imaging Results: US Pelvis Transvanginal Non-ob (tv Only)  Result Date: 11/01/2018 Patient Name: Meleah Demeyer DOB: September 23, 1986 MRN: [id number] ULTRASOUND REPORT Location: [facility] Date of Service: 11/01/2018 Indications: Follow up ovarian cyst Findings: The uterus is anteverted and measures 10.8 x 6.0 x 5.3cm. Echo texture is  homogenous without evidence of focal masses. The Endometrium measures 11.5 mm. Right Ovary measures 3.2 x 2.2 x 2.2cm. It is normal in appearance. Left Ovary measures 4.9 x 2.7 x 1.9 cm with dominant follicle seen. Survey of the adnexa demonstrates no adnexal masses. There is no free fluid in the cul de sac. Impression: 1. Normal gyn ultrasound. Darlina Guys, RDMS RVT The ultrasound images and findings were reviewed by me and I agree with the above report. Thomasene Mohair, MD, [facility], [facility] 11/01/2018 4:55 PM    Assessment: 32 y.o. [id number]  1. Secondary oligomenorrhea      Plan: Problem List Items Addressed This Visit      Other   Oligomenorrhea - Primary (Chronic)   Relevant Medications   norethindrone (AYGESTIN) 5 MG tablet     Secondary oligomenorrhea: The patient has presented for this issue multiple times.  I have discussed with her the issue with oligomenorrhea and risk of uterine cancer.  She states that she does not consistently take the progestin withdrawal medication due to side effects.  Today, we discussed changing the medication to when she might tolerate more.  She is amenable to this plan.  She is interested in becoming pregnant in the coming year.  When she is fully ready, she and I can discuss ovulation induction, as appropriate.  15 minutes spent in face to face discussion with > 50% spent in counseling,management, and coordination of care of her secondary oligomenorrhea.  Thomasene Mohair, MD, [facility], [facility] 11/01/2018 5:12 PM

## 2018-11-06 ENCOUNTER — Encounter: Payer: Self-pay | Admitting: Obstetrics and Gynecology

## 2018-11-23 NOTE — Progress Notes (Signed)
Dr. Karleen Hampshire T. Blaike Vickers, MD, CAQ [facility] [address] [place], [address] Phone: [phone] Fax: [phone]  11/24/2018  Patient: Caroline Fletcher, MRN: [id number], DOB: [birth year], 33 y.o.  Primary Physician:  Hannah Beat, MD   Chief Complaint  Patient presents with  . Follow-up    Thyroid labs from [facility]   Subjective:   Caroline Fletcher is a 33 y.o. very pleasant female patient who presents with the following:  Pleasant lady with HTN and Tourette's here for f/u.  Recent elevated TSH  ? Pregnant. Has a girl and a boy right now.  Had a u preg ? + this AM  Past Medical History, Surgical History, Social History, Family History, Problem List, Medications, and Allergies have been reviewed and updated if relevant.  Patient Active Problem List   Diagnosis Date Noted  . Hypertension 03/16/2018  . Obesity (BMI 35.0-39.9 without comorbidity) 01/06/2018  . Oligomenorrhea 09/08/2017  . Asthma, chronic 06/15/2013  . TOURETTE'S DISORDER ([facility]) 03/07/2008    Past Medical History:  Diagnosis Date  . Asthma, chronic 06/15/2013  . Cleft palate   . History of abnormal cervical Pap smear 2014   LGSIL with positive HRHPV  . 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 Hypertension   . Tourette's disorder    Dr. Zola Button    Past Surgical History:  Procedure Laterality Date  . COLPOSCOPY  2009  . MYRINGOTOMY     bilateral  . Pallet repair  1990    Social History   Socioeconomic History  . Marital status: Married    Spouse name: Not on file  . Number of children: 2  . Years of education: Not on file  . Highest education level: Not on file  Occupational History  . Not on file  Social Needs  . Financial resource strain: Not on file  . Food insecurity:    Worry: Not on file    Inability: Not on file  . Transportation needs:    Medical: Not on file    Non-medical: Not  on file  Tobacco Use  . Smoking status: Never Smoker  . Smokeless tobacco: Never Used  Substance and Sexual Activity  . Alcohol use: No    Comment: Occasional  . Drug use: No  . Sexual activity: Yes    Birth control/protection: None  Lifestyle  . Physical activity:    Days per week: Not on file    Minutes per session: Not on file  . Stress: Not on file  Relationships  . Social connections:    Talks on phone: Not on file    Gets together: Not on file    Attends religious service: Not on file    Active member of club or organization: Not on file    Attends meetings of clubs or organizations: Not on file    Relationship status: Not on file  . Intimate partner violence:    Fear of current or ex partner: Not on file    Emotionally abused: Not on file    Physically abused: Not on file    Forced sexual activity: Not on file  Other Topics Concern  . Not on file  Social History Narrative  . Not on file    Family History  Problem Relation Age of Onset  . Healthy Father   . Healthy Mother   . Stroke Paternal Grandmother   .  Heart attack Paternal Grandmother   . Diabetes Maternal Grandmother   . Cancer Neg Hx     No Known Allergies  Medication list reviewed and updated in full in [facility].   GEN: No acute illnesses, no fevers, chills. GI: No n/v/d, eating normally Pulm: No SOB Interactive and getting along well at home.  Otherwise, ROS is as per the HPI.  Objective:   BP 100/60   Pulse 92   Temp 97.6 F (36.4 C) (Oral)   Ht 5' 2.75" (1.594 m)   Wt 219 lb (99.3 kg)   LMP 05/27/2018   BMI 39.10 kg/m   GEN: WDWN, NAD, Non-toxic, A & O x 3 HEENT: Atraumatic, Normocephalic. Neck supple. No masses, No LAD. Ears and Nose: No external deformity. CV: RRR, No M/G/R. No JVD. No thrill. No extra heart sounds. PULM: CTA B, no wheezes, crackles, rhonchi. No retractions. No resp. distress. No accessory muscle use. EXTR: No c/c/e NEURO Normal gait.  PSYCH:  Normally interactive. Conversant. Not depressed or anxious appearing.  Calm demeanor.   Laboratory and Imaging Data:  Assessment and Plan:   Abnormal TSH - Plan: TSH, T4, free, T3, free  Essential hypertension  Irregular menses - Plan: POCT urine pregnancy, hCG, quantitative, pregnancy  U preg neg - check B quant  Check thyroid labs  Follow-up: No follow-ups on file.  Orders Placed This Encounter  Procedures  . hCG, quantitative, pregnancy  . TSH  . T4, free  . T3, free  . POCT urine pregnancy    Signed,  Rutilio Yellowhair T. Jrake Rodriquez, MD   Outpatient Encounter Medications as of 11/24/2018  Medication Sig  . ARIPiprazole (ABILIFY) 2 MG tablet Take 2 mg by mouth daily.   . cloNIDine (CATAPRES) 0.1 MG tablet Take 0.05 mg by mouth daily.   . hydrochlorothiazide (MICROZIDE) 12.5 MG capsule TAKE ONE CAPSULE BY MOUTH DAILY  . norethindrone (AYGESTIN) 5 MG tablet Take 1 tablet (5 mg total) by mouth daily for 10 days. Take the first 5 calendar days each month (Patient not taking: Reported on 11/24/2018)   No facility-administered encounter medications on file as of 11/24/2018.

## 2018-11-24 ENCOUNTER — Ambulatory Visit (INDEPENDENT_AMBULATORY_CARE_PROVIDER_SITE_OTHER): Payer: Self-pay | Admitting: Family Medicine

## 2018-11-24 ENCOUNTER — Ambulatory Visit: Payer: Medicaid Other | Admitting: Family Medicine

## 2018-11-24 ENCOUNTER — Encounter: Payer: Self-pay | Admitting: Family Medicine

## 2018-11-24 VITALS — BP 100/60 | HR 92 | Temp 97.6°F | Ht 62.75 in | Wt 219.0 lb

## 2018-11-24 DIAGNOSIS — R7989 Other specified abnormal findings of blood chemistry: Secondary | ICD-10-CM

## 2018-11-24 DIAGNOSIS — I10 Essential (primary) hypertension: Secondary | ICD-10-CM

## 2018-11-24 DIAGNOSIS — N926 Irregular menstruation, unspecified: Secondary | ICD-10-CM

## 2018-11-24 LAB — POCT URINE PREGNANCY: Preg Test, Ur: NEGATIVE

## 2018-11-25 ENCOUNTER — Telehealth: Payer: Self-pay | Admitting: Family Medicine

## 2018-11-25 LAB — TSH: TSH: 4.1 u[IU]/mL (ref 0.35–4.50)

## 2018-11-25 LAB — T3, FREE: T3, Free: 3.5 pg/mL (ref 2.3–4.2)

## 2018-11-25 LAB — HCG, QUANTITATIVE, PREGNANCY: QUANTITATIVE HCG: 0.24 m[IU]/mL

## 2018-11-25 LAB — T4, FREE: Free T4: 0.86 ng/dL (ref 0.60–1.60)

## 2018-11-25 NOTE — Telephone Encounter (Signed)
See my phone note - I spoke to her directly.

## 2018-11-25 NOTE — Telephone Encounter (Addendum)
Left message for Ashely that those results are still pending.

## 2018-11-25 NOTE — Telephone Encounter (Signed)
Pt want to know the result of her preg test.

## 2018-11-28 ENCOUNTER — Telehealth: Payer: Self-pay | Admitting: Obstetrics and Gynecology

## 2018-11-28 NOTE — Telephone Encounter (Signed)
I'm still not clear what her question is. I'm assuming she is wondering whether she is pregnant.  She had a serum hCG on 1/2 that was negative.  Did she have a positive pregnancy test before or after this?  The serum pregnancy test is quite accurate. However, I am happy to order her a serum and/or urine pregnancy test in our clinic.  I hope this helps her.   Thanks

## 2018-11-28 NOTE — Telephone Encounter (Signed)
July 5th was her LMP

## 2018-11-28 NOTE — Telephone Encounter (Signed)
Patient had lab work done from her PCP.  Please review the labs in her chart.  She has had a positive home test.  What should be her next step.

## 2018-11-28 NOTE — Telephone Encounter (Signed)
Advise

## 2018-11-28 NOTE — Telephone Encounter (Signed)
It looks like her lab test was negative.  When was her last period?

## 2018-11-28 NOTE — Telephone Encounter (Signed)
Please let me know when pt calls back. Trying to find out LMP.

## 2018-11-30 NOTE — Telephone Encounter (Signed)
Do you know if patient is wanting more blood work, Public relations account executive?

## 2018-12-01 ENCOUNTER — Other Ambulatory Visit: Payer: Self-pay | Admitting: Obstetrics and Gynecology

## 2018-12-01 DIAGNOSIS — N926 Irregular menstruation, unspecified: Secondary | ICD-10-CM

## 2018-12-01 NOTE — Telephone Encounter (Signed)
This occurs every few months for her.  I will put in an order for a quantitative hCG, if she would like to come in and have it drawn.  Her most recent blood work for pregnancy was on 11/24/2018.

## 2018-12-02 ENCOUNTER — Telehealth: Payer: Self-pay | Admitting: Family Medicine

## 2018-12-02 NOTE — Telephone Encounter (Signed)
Pt want to know status of disability determination that was fax here. Please advise

## 2018-12-03 NOTE — Telephone Encounter (Signed)
There was nothing on my desk as of Friday afternoon. I will check again on Monday when in the office to get some clarity.

## 2018-12-05 NOTE — Telephone Encounter (Signed)
Pt coming in for lab work , felicia will let her know

## 2018-12-05 NOTE — Telephone Encounter (Signed)
Spoke with pt to let her know we have not received any paperwork.  I asked her to have them refax paperwork  Gave her fax # [phone]

## 2018-12-05 NOTE — Telephone Encounter (Signed)
I don't see anything about this or any paperwork, nor was anything mentioned to me.  Can you check with the front office? If nothing surfaces, you will need to call the patient. I don't know anything about disability for her.

## 2018-12-09 ENCOUNTER — Other Ambulatory Visit: Payer: Medicaid Other

## 2018-12-13 NOTE — Telephone Encounter (Signed)
Pt called office to check if any paperwork had been received. Pt states they faxed over the paper work again on 12/07/18.

## 2018-12-13 NOTE — Telephone Encounter (Signed)
Left message asking pt to call office  We did received disability determination services forms requesting records on 12/07/2018 this was emailed to [employer] our copier services on 12/07/2018.  It takes 14 to 17 business days for them to copy and mail out records that were requested

## 2019-01-23 ENCOUNTER — Other Ambulatory Visit: Payer: Medicaid Other

## 2019-01-23 DIAGNOSIS — N926 Irregular menstruation, unspecified: Secondary | ICD-10-CM

## 2019-01-24 LAB — BETA HCG QUANT (REF LAB): hCG Quant: <1 m[IU]/mL

## 2019-01-25 NOTE — Telephone Encounter (Signed)
Pt called stating blood work from Monday was negative; having issues c cycle x 1 1/2 yrs; has had two positive preg tests at home.  What to do?  [phone]

## 2019-03-09 ENCOUNTER — Telehealth: Payer: Self-pay | Admitting: Family Medicine

## 2019-03-09 MED ORDER — HYDROCHLOROTHIAZIDE 12.5 MG PO CAPS: ORAL_CAPSULE | ORAL | 1 refills | Status: DC

## 2019-03-09 NOTE — Telephone Encounter (Signed)
Refill sent as instructed by Dr. Patsy Lager.

## 2019-03-09 NOTE — Telephone Encounter (Signed)
Last office visit 11/24/2018 for abnormal TSH  Patient states she stopped taking it in January but is now swelling again. Ok to refill?

## 2019-03-09 NOTE — Telephone Encounter (Signed)
Best number [phone]  Pt called needing to get a refill on hydrochlorothiazide her drug store has changed to [facility] [place]. Pt stopped taking them in [place].  She stated she is retaining fluid again in her ankles

## 2019-03-09 NOTE — Telephone Encounter (Signed)
Completely fine  #90, 1 ref

## 2019-03-14 ENCOUNTER — Telehealth: Payer: Self-pay

## 2019-03-14 NOTE — Telephone Encounter (Signed)
Patient transferred to me from front desk. She reports that she is on day 3 of her cycle & just passed a blood clot the size of her hand. She has never had one this large before (usually size of a quarter). She is wondering if she had a miscarriage. Advised to monitor for signs of hemorrhage (fully soaking a regular size pad & having to change more than one time per hour) Report to ER if happens. She has not taken any pregnancy tests. Advised message will be sent to her provider SDJ who will be here tomorrow & can advise if labs or appt for eval is needed. Pt will take pregnancy test in the meantime at home & give results when she is contacted tomorrow.

## 2019-03-15 ENCOUNTER — Telehealth: Payer: Self-pay | Admitting: Obstetrics and Gynecology

## 2019-03-15 NOTE — Telephone Encounter (Signed)
I agree with the advice given.  Just call her to check on her and check the level of bleeding she is having. If she has continued to bleeding > 1 pad per hour, she likely would have been in the hospital (possibly not by choice) due to the amount of bleeding.  We'll go from here.

## 2019-03-15 NOTE — Telephone Encounter (Signed)
The patient called and stated that she used to go to [facility] but they do not have any openings. The patient believes she may be having a miscarriage. The patient is experiencing heavy bleeding, Passing clots that are palm size, lower back pain, and what seems to be dilating. The patient is hoping to speak with a nurse and possible see a provider here at our office. Please advise.

## 2019-03-15 NOTE — Telephone Encounter (Signed)
Patient is calling to follow up. Please advise.

## 2019-03-15 NOTE — Telephone Encounter (Signed)
Spoke with Dr. Logan Bores about patient. Hey stated that we could see the patient if she is wanting to transfer care on Thursday or Friday. I spoke with the patient and informed her that we could see her in the office Thursday or Friday if she would like to transfer back to [place]. I did tell her that Dr. Jean Rosenthal had responded to her question from [facility]. She has decided to stay with them and see if they can see her today. She will call back if she would like to transfer.

## 2019-03-15 NOTE — Telephone Encounter (Signed)
Please see about getting this patient on my schedule for tomorrow (4/23). Thanks!

## 2019-03-15 NOTE — Telephone Encounter (Signed)
Please let me know when pt calls me back  

## 2019-03-16 ENCOUNTER — Encounter: Payer: Self-pay | Admitting: Obstetrics and Gynecology

## 2019-03-16 ENCOUNTER — Ambulatory Visit (INDEPENDENT_AMBULATORY_CARE_PROVIDER_SITE_OTHER): Payer: BLUE CROSS/BLUE SHIELD | Admitting: Obstetrics and Gynecology

## 2019-03-16 ENCOUNTER — Telehealth: Payer: Self-pay | Admitting: Obstetrics and Gynecology

## 2019-03-16 ENCOUNTER — Other Ambulatory Visit: Payer: Self-pay

## 2019-03-16 VITALS — BP 148/90 | Ht 62.0 in | Wt 218.0 lb

## 2019-03-16 DIAGNOSIS — N921 Excessive and frequent menstruation with irregular cycle: Secondary | ICD-10-CM

## 2019-03-16 DIAGNOSIS — Z3202 Encounter for pregnancy test, result negative: Secondary | ICD-10-CM | POA: Diagnosis not present

## 2019-03-16 LAB — POCT HEMOGLOBIN: Hemoglobin: 11.3 g/dL (ref 11–14.6)

## 2019-03-16 LAB — POCT URINE PREGNANCY: Preg Test, Ur: NEGATIVE

## 2019-03-16 NOTE — Telephone Encounter (Signed)
Patient requested to be seen today

## 2019-03-16 NOTE — Patient Instructions (Signed)
I value your feedback and entrusting us with your care. If you get a [place] [facility] patient survey, I would appreciate you taking the time to let us know about your experience today. Thank you! 

## 2019-03-16 NOTE — Telephone Encounter (Signed)
See mychart messages

## 2019-03-16 NOTE — Telephone Encounter (Signed)
Attempted to schedule patient with SDJ patient requested to be seen today 03/15/19 instead of waiting on next available appointment with [facility]

## 2019-03-16 NOTE — Progress Notes (Signed)
Caroline Fletcher, [place], MD   Chief Complaint  Patient presents with  . Vaginal Bleeding    HPI:      Ms. Caroline Fletcher is a 33 y.o. [phone] who LMP was Patient's last menstrual period was 03/06/2019., presents today for heavy menses this cycle. Hx of irreg cycles in past requiring aygestin use for 10 days Qmonth. Last used 1/20 because pt had regular monthly menses on her own 2/20 and 3/20. Menses were 5 days, light flow changing pads BID, no dysmen. Pt started spotting 4/13 with real flow starting 03/12/19. Flow has been heavy for 5 days, changing overnight pads about every 2 hrs and with clots (unusual for pt). Having cramping/LBP with bleeding, no meds taken. Feels weak with bleeding. Had normal thyroid 1/20 and neg GYN u/s 12/19. Hx of ovar cysts in past.  Pt is sex active with husband, using condoms most of the time. Last pap 11/19.   Past Medical History:  Diagnosis Date  . Asthma, chronic 06/15/2013  . Cleft palate   . History of abnormal cervical Pap smear 2014   LGSIL with positive HRHPV  . 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 Hypertension   . Tourette's disorder    Dr. Zola ButtonHaq    Past Surgical History:  Procedure Laterality Date  . COLPOSCOPY  2009  . MYRINGOTOMY     bilateral  . Pallet repair  1990    Family History  Problem Relation Age of Onset  . Healthy Father   . Healthy Mother   . Stroke Paternal Grandmother   . Heart attack Paternal Grandmother   . Diabetes Maternal Grandmother   . Cancer Neg Hx     Social History   Socioeconomic History  . Marital status: Married    Spouse name: Not on file  . Number of children: 2  . Years of education: Not on file  . Highest education level: Not on file  Occupational History  . Not on file  Social Needs  . Financial resource strain: Not on file  . Food insecurity:    Worry: Not on file    Inability: Not on file  . Transportation needs:    Medical: Not on file   Non-medical: Not on file  Tobacco Use  . Smoking status: Never Smoker  . Smokeless tobacco: Never Used  Substance and Sexual Activity  . Alcohol use: No    Comment: Occasional  . Drug use: No  . Sexual activity: Yes    Birth control/protection: None  Lifestyle  . Physical activity:    Days per week: Not on file    Minutes per session: Not on file  . Stress: Not on file  Relationships  . Social connections:    Talks on phone: Not on file    Gets together: Not on file    Attends religious service: Not on file    Active member of club or organization: Not on file    Attends meetings of clubs or organizations: Not on file    Relationship status: Not on file  . Intimate partner violence:    Fear of current or ex partner: Not on file    Emotionally abused: Not on file    Physically abused: Not on file    Forced sexual activity: Not on file  Other Topics Concern  . Not on file  Social History Narrative  . Not on file    Current Outpatient Medications:  .  ARIPiprazole (ABILIFY) 2 MG tablet, Take 2 mg by mouth daily. , Disp: , Rfl:  .  cloNIDine (CATAPRES) 0.1 MG tablet, Take 0.05 mg by mouth daily. , Disp: , Rfl:  .  hydrochlorothiazide (MICROZIDE) 12.5 MG capsule, TAKE ONE CAPSULE BY MOUTH DAILY, Disp: 90 capsule, Rfl: 1 .  norethindrone (AYGESTIN) 5 MG tablet, Take 1 tablet (5 mg total) by mouth daily for 10 days. Take the first 5 calendar days each month (Patient not taking: Reported on 11/24/2018), Disp: 10 tablet, Rfl: 5    ROS:  Review of Systems  Constitutional: Positive for fatigue. Negative for fever and unexpected weight change.  Respiratory: Negative for cough, shortness of breath and wheezing.   Cardiovascular: Negative for chest pain, palpitations and leg swelling.  Gastrointestinal: Negative for blood in stool, constipation, diarrhea, nausea and vomiting.  Endocrine: Negative for cold intolerance, heat intolerance and polyuria.  Genitourinary: Positive for  frequency and menstrual problem. Negative for dyspareunia, dysuria, flank pain, genital sores, hematuria, pelvic pain, urgency, vaginal bleeding, vaginal discharge and vaginal pain.  Musculoskeletal: Negative for back pain, joint swelling and myalgias.  Skin: Negative for rash.  Neurological: Negative for dizziness, syncope, light-headedness, numbness and headaches.  Hematological: Negative for adenopathy.  Psychiatric/Behavioral: Negative for agitation, confusion, sleep disturbance and suicidal ideas. The patient is not nervous/anxious.      OBJECTIVE:   Vitals:  BP (!) 148/90   Ht  (1.575 m)   Wt 218 lb (98.9 kg)   LMP 03/06/2019   BMI 39.87 kg/m   Physical Exam Vitals signs reviewed.  Constitutional:      Appearance: She is well-developed.  Neck:     Musculoskeletal: Normal range of motion.  Pulmonary:     Effort: Pulmonary effort is normal.  Genitourinary:    General: Normal vulva.     Pubic Area: No rash.      Labia:        Right: No rash, tenderness or lesion.        Left: No rash, tenderness or lesion.      Vagina: Bleeding present. No vaginal discharge, erythema or tenderness.     Cervix: Normal.     Uterus: Normal. Tender. Not enlarged.      Adnexa: Right adnexa normal and left adnexa normal.       Right: No mass or tenderness.         Left: No mass or tenderness.    Musculoskeletal: Normal range of motion.  Skin:    General: Skin is warm and dry.  Neurological:     General: No focal deficit present.     Mental Status: She is alert and oriented to person, place, and time.  Psychiatric:        Mood and Affect: Mood normal.        Behavior: Behavior normal.        Thought Content: Thought content normal.        Judgment: Judgment normal.     Results: Results for orders placed or performed in visit on 03/16/19 (from the past 24 hour(s))  POCT urine pregnancy     Status: Normal   Collection Time: 03/16/19  9:39 AM  Result Value Ref Range   Preg  Test, Ur Negative Negative  POCT hemoglobin     Status: Normal   Collection Time: 03/16/19  9:58 AM  Result Value Ref Range   Hemoglobin 11.3 11 - 14.6 g/dL     Assessment/Plan: Menorrhagia with irregular cycle -  Plan: POCT urine pregnancy, POCT hemoglobin  Heavy menses this cycle. Neg UPT today, borderline HgB. Bleeding on exam and tenderness with palpation. Neg GYN u/s 12/19, normal thyroid 1/20. Most likely abnormal period this month. Reassurance. Add MVI with iron. Follow along. If sx persist, can try prog to stop flow. F/u via phone/message tomorrow if sx persist. Also note forwarded to Dr. Jean Rosenthal for review.     Return if symptoms worsen or fail to improve.  Wilburn Keir B. Angellica Maddison, PA-C 03/16/2019 10:07 AM

## 2019-04-03 ENCOUNTER — Other Ambulatory Visit: Payer: Self-pay

## 2019-04-03 ENCOUNTER — Encounter: Payer: Self-pay | Admitting: Obstetrics and Gynecology

## 2019-04-03 ENCOUNTER — Ambulatory Visit (INDEPENDENT_AMBULATORY_CARE_PROVIDER_SITE_OTHER): Payer: BLUE CROSS/BLUE SHIELD | Admitting: Obstetrics and Gynecology

## 2019-04-03 VITALS — BP 120/77 | HR 94 | Ht 62.0 in | Wt 218.1 lb

## 2019-04-03 DIAGNOSIS — N63 Unspecified lump in unspecified breast: Secondary | ICD-10-CM | POA: Diagnosis not present

## 2019-04-03 DIAGNOSIS — Z803 Family history of malignant neoplasm of breast: Secondary | ICD-10-CM | POA: Diagnosis not present

## 2019-04-03 DIAGNOSIS — N926 Irregular menstruation, unspecified: Secondary | ICD-10-CM

## 2019-04-03 DIAGNOSIS — Z3202 Encounter for pregnancy test, result negative: Secondary | ICD-10-CM

## 2019-04-03 LAB — POCT URINE PREGNANCY: Preg Test, Ur: NEGATIVE

## 2019-04-03 NOTE — Progress Notes (Signed)
PT is present today for confirmation of pregnancy. Pt LMP 03/06/19. UPT done today results were neg. Pt stated that she has noticed brown discharge that started this morning, lower back pain, and waking up feeling dizzy and noticed  lightheadedness that started last week.

## 2019-04-03 NOTE — Progress Notes (Signed)
GYNECOLOGY CLINIC PROGRESS NOTE Subjective:    Caroline Fletcher is a 33 y.o. [phone] female who presents for evaluation of amenorrhea. She believes she could be pregnant. Pregnancy is desired. Sexual Activity: single partner, contraception: none. Current symptoms also include: positive home pregnancy test (although patient notes it was a faint line noted, took test ~ 2 days ago). Last period was normal, occurred 03/06/2019, however was a little heavier than normal.  Of note, she is transferring care from [facility] where she was seen last month. She notes that she has been having some dark brown discharge/spotting after intercourse yesterday.   She also expresses concern due to an area under her arm near her breast that is tender and feels a nodule. This has also been present for several days.  Notes that she is more concerned about it due to her MGM being diagnosed with breast cancer fairly recently.      OB History  Gravida Para Term Preterm AB Living  4 2 2   1 2   SAB TAB Ectopic Multiple Live Births  1     0 2    # Outcome Date GA Lbr Len/2nd Weight Sex Delivery Anes PTL Lv  4 Gravida           3 Term 08/23/16 [redacted]w[redacted]d / 01:42 7 lb 2.6 oz (3.25 kg) F Vag-Spont EPI  LIV  2 Term 10/31/14   7 lb 5 oz (3.317 kg)  Vag-Spont   LIV  1 SAB      SAB       Past Medical History:  Diagnosis Date  . Asthma, chronic 06/15/2013  . Cleft palate   . History of abnormal cervical Pap smear 2014   LGSIL with positive HRHPV  . 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 Hypertension   . Tourette's disorder    Dr. Zola Button    Family History  Problem Relation Age of Onset  . Healthy Father   . Healthy Mother   . Stroke Paternal Grandmother   . Heart attack Paternal Grandmother   . Diabetes Maternal Grandmother   . Cancer Neg Hx     Social History   Socioeconomic History  . Marital status: Married    Spouse name: Not on file  . Number of children: 2  .  Years of education: Not on file  . Highest education level: Not on file  Occupational History  . Not on file  Social Needs  . Financial resource strain: Not on file  . Food insecurity:    Worry: Not on file    Inability: Not on file  . Transportation needs:    Medical: Not on file    Non-medical: Not on file  Tobacco Use  . Smoking status: Never Smoker  . Smokeless tobacco: Never Used  Substance and Sexual Activity  . Alcohol use: No    Comment: Occasional  . Drug use: No  . Sexual activity: Yes    Birth control/protection: None  Lifestyle  . Physical activity:    Days per week: Not on file    Minutes per session: Not on file  . Stress: Not on file  Relationships  . Social connections:    Talks on phone: Not on file    Gets together: Not on file    Attends religious service: Not on file    Active member of club or organization: Not on file    Attends meetings of  clubs or organizations: Not on file    Relationship status: Not on file  . Intimate partner violence:    Fear of current or ex partner: Not on file    Emotionally abused: Not on file    Physically abused: Not on file    Forced sexual activity: Not on file  Other Topics Concern  . Not on file  Social History Narrative  . Not on file    Current Outpatient Medications on File Prior to Visit  Medication Sig Dispense Refill  . ARIPiprazole (ABILIFY) 2 MG tablet Take 2 mg by mouth daily.     . cloNIDine (CATAPRES) 0.1 MG tablet Take 0.1 mg by mouth daily.     . hydrochlorothiazide (MICROZIDE) 12.5 MG capsule TAKE ONE CAPSULE BY MOUTH DAILY 90 capsule 1  . norethindrone (AYGESTIN) 5 MG tablet Take 1 tablet (5 mg total) by mouth daily for 10 days. Take the first 5 calendar days each month (Patient not taking: Reported on 11/24/2018) 10 tablet 5   No current facility-administered medications on file prior to visit.     No Known Allergies   Review of Systems Pertinent items noted in HPI and remainder of  comprehensive ROS otherwise negative.     Objective:    BP 120/77   Pulse 94   Ht 5\' 2"  (1.575 m)   Wt 218 lb 1.6 oz (98.9 kg)   LMP 03/06/2019   BMI 39.89 kg/m  General: alert, no distress and no acute distress   Breast: Breasts: breasts appear normal, no suspicious masses, no skin or nipple changes or axillary nodes.  Slight thickening of area in left axillary tail but may be due to hormonal changes.  Pelvis: external genitalia normal, rectovaginal septum normal.  Vagina with scant brown bloody discharge.  Cervix normal appearing, no lesions and no motion tenderness, os closed.  Uterus mobile, nontender, normal shape and size.  Adnexae non-palpable, nontender bilaterally.    Lab Review Urine HCG: negative    Assessment:    Absence of menstruation.    History of positive home UPT  Breast lump in female  Family history of breast cancer  Plan:   - Pregnancy Test: Negative: Patient informed of results.  Offered blood confirmation testing as patient is not currently a full 4 weeks from her last cycle and had positive home UPT.  - Breasts with essentially negative findings. Offered reassurance.

## 2019-04-04 LAB — BETA HCG QUANT (REF LAB): hCG Quant: <1 m[IU]/mL

## 2019-04-05 ENCOUNTER — Other Ambulatory Visit: Payer: Self-pay

## 2019-04-05 ENCOUNTER — Encounter: Payer: Self-pay | Admitting: Obstetrics and Gynecology

## 2019-04-05 ENCOUNTER — Ambulatory Visit (INDEPENDENT_AMBULATORY_CARE_PROVIDER_SITE_OTHER): Payer: BLUE CROSS/BLUE SHIELD | Admitting: Obstetrics and Gynecology

## 2019-04-05 ENCOUNTER — Other Ambulatory Visit (INDEPENDENT_AMBULATORY_CARE_PROVIDER_SITE_OTHER): Payer: BLUE CROSS/BLUE SHIELD

## 2019-04-05 VITALS — BP 122/77 | HR 86 | Ht 62.0 in | Wt 217.1 lb

## 2019-04-05 DIAGNOSIS — Z8742 Personal history of other diseases of the female genital tract: Secondary | ICD-10-CM

## 2019-04-05 DIAGNOSIS — R102 Pelvic and perineal pain: Secondary | ICD-10-CM

## 2019-04-05 NOTE — Progress Notes (Signed)
    GYNECOLOGY PROGRESS NOTE  Subjective:    Patient ID: Caroline Fletcher, female    DOB: 10-19-1986, 33 y.o.   MRN: [id number]  HPI  Patient is a 33 y.o. [phone] married female who presents for complaints of continued right sided pelvic pain after intercourse along with brown discharge from the vagina.  Of note, patient was seen 2 days ago for similar complaints.  At that time she also presented with a faintly positive home pregnancy test.  Pregnancy test in office was negative and a serologic beta hCG was performed which was also negative (patient was informed of this result this morning through Mychart).  He states that she has never had pelvic pain with intercourse in the past.  Her menses is due in the next several days so she is unsure if the spotting is related to that.  Does note a history of ovarian cyst in the past, she does not endorse any personal or family history of endometriosis or fibroids.  She has never experienced dysmenorrhea or painful periods in the past.  She denies any urinary disturbances, vaginal itching, burning, or other discharge besides the brown bloody spotting.  He is currently taking Tylenol which does give her some relief of the pain.   The following portions of the patient's history were reviewed and updated as appropriate: allergies, current medications, past family history, past medical history, past social history, past surgical history and problem list.  Review of Systems Pertinent items noted in HPI and remainder of comprehensive ROS otherwise negative.   Objective:   Blood pressure 122/77, pulse 86, height 5\' 2"  (1.575 m), weight 217 lb 1.6 oz (98.5 kg), last menstrual period 03/06/2019, unknown if currently breastfeeding. General appearance: alert and no distress Abdomen: soft, non-tender; bowel sounds normal; no masses,  no organomegaly Pelvic: external genitalia normal, rectovaginal septum normal.  Vagina with small amount of dark brown discharge.  Cervix  normal appearing, no lesions and no motion tenderness.  Uterus mobile, nontender, normal shape and size.  Adnexae non-palpable, mildly tender on right side.     Labs:  Results for orders placed or performed in visit on 04/03/19  Beta hCG quant (ref lab)  Result Value Ref Range   hCG Quant <1 mIU/mL  POCT urine pregnancy  Result Value Ref Range   Preg Test, Ur Negative Negative     Assessment:   Pelvic pain in female History of ovarian cyst  Plan:   - Patient with pelvic pain for the last several days, mostly associated with intercourse.  Pain is located mainly on the right side.  Will order pelvic ultrasound to rule out ovarian cyst or any other etiologies of the reproductive organs.  She does have a history of ovarian cyst in the past.  Advised patient to alternate with Tylenol and ibuprofen as she is currently only using Tylenol for the pain. - Will f/u accordingly after ultrasound.   Hildred Laser, MD [facility]

## 2019-04-05 NOTE — Patient Instructions (Addendum)
Pelvic Pain, Female Pelvic pain is pain in your lower abdomen, below your belly button and between your hips. The pain may start suddenly (be acute), keep coming back (be recurring), or last a long time (become chronic). Pelvic pain that lasts longer than 6 months is considered chronic. Pelvic pain may affect your:  Reproductive organs.  Urinary system.  Digestive tract.  Musculoskeletal system. There are many potential causes of pelvic pain. Sometimes, the pain can be a result of digestive or urinary conditions, strained muscles or ligaments, or reproductive conditions. Sometimes the cause of pelvic pain is not known. Follow these instructions at home:   Take over-the-counter and prescription medicines only as told by your health care provider.  Rest as told by your health care provider.  Do not have sex if it hurts.  Keep a journal of your pelvic pain. Write down: ? When the pain started. ? Where the pain is located. ? What seems to make the pain better or worse, such as food or your period (menstrual cycle). ? Any symptoms you have along with the pain.  Keep all follow-up visits as told by your health care provider. This is important. Contact a health care provider if:  Medicine does not help your pain.  Your pain comes back.  You have new symptoms.  You have abnormal vaginal discharge or bleeding, including bleeding after menopause.  You have a fever or chills.  You are constipated.  You have blood in your urine or stool.  You have foul-smelling urine.  You feel weak or light-headed. Get help right away if:  You have sudden severe pain.  Your pain gets steadily worse.  You have severe pain along with fever, nausea, vomiting, or excessive sweating.  You lose consciousness. Summary  Pelvic pain is pain in your lower abdomen, below your belly button and between your hips.  There are many potential causes of pelvic pain.  Keep a journal of your pelvic  pain. This information is not intended to replace advice given to you by your health care provider. Make sure you discuss any questions you have with your health care provider. Document Released: 10/06/2004 Document Revised: 04/27/2018 Document Reviewed: 04/27/2018 Elsevier Interactive Patient Education  2019 Elsevier Inc.  

## 2019-04-05 NOTE — Progress Notes (Signed)
Pt stated that she is having right-side pain after intercourse along with some brown discharge like blood from her cycle but it has not started yet.

## 2019-04-12 ENCOUNTER — Telehealth: Payer: Self-pay

## 2019-04-12 NOTE — Telephone Encounter (Signed)
Coronavirus (COVID-19) Are you at risk?  Are you at risk for the Coronavirus (COVID-19)?  To be considered HIGH RISK for Coronavirus (COVID-19), you have to meet the following criteria:  . Traveled to [place], [place], [place], [place] or [place]; or in the [place] to [place], [place], [place], or [place]; and have fever, cough, and shortness of breath within the last 2 weeks of travel OR . Been in close contact with a person diagnosed with COVID-19 within the last 2 weeks and have fever, cough, and shortness of breath . IF YOU DO NOT MEET THESE CRITERIA, YOU ARE CONSIDERED LOW RISK FOR COVID-19.  What to do if you are HIGH RISK for COVID-19?  . If you are having a medical emergency, call 911. . Seek medical care right away. Before you go to a doctor's office, urgent care or emergency department, call ahead and tell them about your recent travel, contact with someone diagnosed with COVID-19, and your symptoms. You should receive instructions from your physician's office regarding next steps of care.  . When you arrive at healthcare provider, tell the healthcare staff immediately you have returned from visiting [place], [place], [place], [place] or [place]; or traveled in the [place] to [place], [place], [place], or [place]; in the last two weeks or you have been in close contact with a person diagnosed with COVID-19 in the last 2 weeks.   . Tell the health care staff about your symptoms: fever, cough and shortness of breath. . After you have been seen by a medical provider, you will be either: o Tested for (COVID-19) and discharged home on quarantine except to seek medical care if symptoms worsen, and asked to  - Stay home and avoid contact with others until you get your results (4-5 days)  - Avoid travel on public transportation if possible (such as bus, train, or airplane) or o Sent to the Emergency Department by EMS for evaluation, COVID-19 testing, and possible  admission depending on your condition and test results.  What to do if you are LOW RISK for COVID-19?  Reduce your risk of any infection by using the same precautions used for avoiding the common cold or flu:  . Wash your hands often with soap and warm water for at least 20 seconds.  If soap and water are not readily available, use an alcohol-based hand sanitizer with at least 60% alcohol.  . If coughing or sneezing, cover your mouth and nose by coughing or sneezing into the elbow areas of your shirt or coat, into a tissue or into your sleeve (not your hands). . Avoid shaking hands with others and consider head nods or verbal greetings only. . Avoid touching your eyes, nose, or mouth with unwashed hands.  . Avoid close contact with people who are sick. . Avoid places or events with large numbers of people in one location, like concerts or sporting events. . Carefully consider travel plans you have or are making. . If you are planning any travel outside or inside the [place], visit the CDC's Travelers' Health webpage for the latest health notices. . If you have some symptoms but not all symptoms, continue to monitor at home and seek medical attention if your symptoms worsen. . If you are having a medical emergency, call 911.   ADDITIONAL HEALTHCARE OPTIONS FOR PATIENTS  [facility] / e-Visit: [email or web address]         [facility]: [phone]  [facility]: [phone]                   [facility]: [phone]   Prescreened. Neg .cm 

## 2019-04-13 ENCOUNTER — Other Ambulatory Visit (INDEPENDENT_AMBULATORY_CARE_PROVIDER_SITE_OTHER): Payer: BLUE CROSS/BLUE SHIELD

## 2019-04-13 ENCOUNTER — Other Ambulatory Visit: Payer: Self-pay | Admitting: Obstetrics and Gynecology

## 2019-04-13 ENCOUNTER — Other Ambulatory Visit: Payer: Self-pay

## 2019-04-13 DIAGNOSIS — R102 Pelvic and perineal pain: Secondary | ICD-10-CM

## 2019-04-13 DIAGNOSIS — N926 Irregular menstruation, unspecified: Secondary | ICD-10-CM

## 2019-04-18 DIAGNOSIS — Z3201 Encounter for pregnancy test, result positive: Secondary | ICD-10-CM

## 2019-04-18 NOTE — Telephone Encounter (Signed)
I sent her a mychart message over the weekend about her ultrasound results being normal, just like the previous scan.

## 2019-04-19 ENCOUNTER — Other Ambulatory Visit: Payer: Self-pay

## 2019-04-19 ENCOUNTER — Other Ambulatory Visit: Payer: BLUE CROSS/BLUE SHIELD

## 2019-04-19 DIAGNOSIS — Z3200 Encounter for pregnancy test, result unknown: Secondary | ICD-10-CM

## 2019-04-19 DIAGNOSIS — Z3201 Encounter for pregnancy test, result positive: Secondary | ICD-10-CM | POA: Diagnosis not present

## 2019-04-20 LAB — BETA HCG QUANT (REF LAB): hCG Quant: <1 m[IU]/mL

## 2019-04-26 DIAGNOSIS — Z8742 Personal history of other diseases of the female genital tract: Secondary | ICD-10-CM | POA: Diagnosis not present

## 2019-04-26 DIAGNOSIS — R9389 Abnormal findings on diagnostic imaging of other specified body structures: Secondary | ICD-10-CM | POA: Diagnosis not present

## 2019-04-26 DIAGNOSIS — N914 Secondary oligomenorrhea: Secondary | ICD-10-CM | POA: Diagnosis not present

## 2019-04-26 DIAGNOSIS — N9489 Other specified conditions associated with female genital organs and menstrual cycle: Secondary | ICD-10-CM | POA: Diagnosis not present

## 2019-04-26 DIAGNOSIS — N912 Amenorrhea, unspecified: Secondary | ICD-10-CM | POA: Diagnosis not present

## 2019-04-27 DIAGNOSIS — N9489 Other specified conditions associated with female genital organs and menstrual cycle: Secondary | ICD-10-CM | POA: Diagnosis not present

## 2019-04-27 DIAGNOSIS — N914 Secondary oligomenorrhea: Secondary | ICD-10-CM | POA: Diagnosis not present

## 2019-04-27 DIAGNOSIS — R7989 Other specified abnormal findings of blood chemistry: Secondary | ICD-10-CM | POA: Diagnosis not present

## 2019-05-01 DIAGNOSIS — R9389 Abnormal findings on diagnostic imaging of other specified body structures: Secondary | ICD-10-CM | POA: Diagnosis not present

## 2019-05-01 DIAGNOSIS — N9489 Other specified conditions associated with female genital organs and menstrual cycle: Secondary | ICD-10-CM | POA: Diagnosis not present

## 2019-05-01 DIAGNOSIS — N926 Irregular menstruation, unspecified: Secondary | ICD-10-CM | POA: Diagnosis not present

## 2019-05-01 DIAGNOSIS — O021 Missed abortion: Secondary | ICD-10-CM | POA: Diagnosis not present

## 2019-05-02 ENCOUNTER — Other Ambulatory Visit
Admission: RE | Admit: 2019-05-02 | Discharge: 2019-05-02 | Disposition: A | Discharge status: Home / Self Care | Payer: BC Managed Care – PPO | Source: Ambulatory Visit | Attending: Obstetrics & Gynecology | Admitting: Obstetrics & Gynecology

## 2019-05-02 ENCOUNTER — Other Ambulatory Visit: Payer: Self-pay

## 2019-05-02 DIAGNOSIS — Z1159 Encounter for screening for other viral diseases: Secondary | ICD-10-CM | POA: Insufficient documentation

## 2019-05-02 DIAGNOSIS — Z01812 Encounter for preprocedural laboratory examination: Secondary | ICD-10-CM | POA: Insufficient documentation

## 2019-05-02 LAB — SARS CORONAVIRUS 2 BY RT PCR (HOSPITAL ORDER, PERFORMED IN ~~LOC~~ HOSPITAL LAB): SARS Coronavirus 2: NEGATIVE

## 2019-05-02 NOTE — H&P (Signed)
Preoperative History and Physical  Caroline Fletcher is a 33 y.o. [phone] here for surgical management of irregular bleeding, thickened endometrium, pregnancy prevention, and complex adnexal mass.   Proposed surgery: dilation and curettage, hysteroscopy, insertion of IUD, diagnostic laparoscopy with removal of adnexal mass  Past Medical History:  Diagnosis Date  . Asthma, chronic 06/15/2013  . Cleft palate   . History of abnormal cervical Pap smear 2014   LGSIL with positive HRHPV  . 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 Hypertension   . Tourette's disorder    Dr. Zola ButtonHaq   Past Surgical History:  Procedure Laterality Date  . COLPOSCOPY  2009  . MYRINGOTOMY     bilateral  . Pallet repair  1990   OB History  Gravida Para Term Preterm AB Living  4 2 2   1 2   SAB TAB Ectopic Multiple Live Births  1     0 2    # Outcome Date GA Lbr Len/2nd Weight Sex Delivery Anes PTL Lv  4 Gravida           3 Term 08/23/16 3071w3d / 01:42 3250 g F Vag-Spont EPI  LIV  2 Term 10/31/14   3317 g  Vag-Spont   LIV  1 SAB      SAB     Patient denies any other pertinent gynecologic issues.   No current facility-administered medications on file prior to encounter.    Current Outpatient Medications on File Prior to Encounter  Medication Sig Dispense Refill  . ARIPiprazole (ABILIFY) 2 MG tablet Take 2 mg by mouth daily.     . cloNIDine (CATAPRES) 0.1 MG tablet Take 0.1 mg by mouth daily.     . hydrochlorothiazide (MICROZIDE) 12.5 MG capsule TAKE ONE CAPSULE BY MOUTH DAILY (Patient taking differently: Take 12.5 mg by mouth daily. ) 90 capsule 1  . medroxyPROGESTERone (PROVERA) 10 MG tablet Take 20 mg by mouth 3 (three) times daily.     No Known Allergies  Social History:   reports that she has never smoked. She has never used smokeless tobacco. She reports that she does not drink alcohol or use drugs.  Family History  Problem Relation Age of Onset  . Healthy  Father   . Healthy Mother   . Stroke Paternal Grandmother   . Heart attack Paternal Grandmother   . Diabetes Maternal Grandmother   . Cancer Neg Hx     Review of Systems: Noncontributory  PHYSICAL EXAM: Blood pressure 132/80, pulse 98, temperature 98.1 F (36.7 C), temperature source Oral, resp. rate 18, height 5\' 2"  (1.575 m), weight 101.2 kg, SpO2 100 %, unknown if currently breastfeeding. General appearance - alert, well appearing, and in no distress Chest - clear to auscultation, no wheezes, rales or rhonchi, symmetric air entry Heart - normal rate and regular rhythm Abdomen - soft, nontender, nondistended, no masses or organomegaly Pelvic - examination not indicated Extremities - peripheral pulses normal, no pedal edema, no clubbing or cyanosis  Labs: Results for orders placed or performed during the hospital encounter of 05/03/19 (from the past 336 hour(s))  Pregnancy, urine POC   Collection Time: 05/03/19 10:14 AM  Result Value Ref Range   Preg Test, Ur NEGATIVE NEGATIVE  CBC   Collection Time: 05/03/19 10:15 AM  Result Value Ref Range   WBC 9.1 4.0 - 10.5 K/uL   RBC 4.04 3.87 - 5.11 MIL/uL   Hemoglobin 11.3 (L) 12.0 - 15.0 g/dL  HCT 33.7 (L) 36.0 - 46.0 %   MCV 83.4 80.0 - 100.0 fL   MCH 28.0 26.0 - 34.0 pg   MCHC 33.5 30.0 - 36.0 g/dL   RDW 16.111.9 09.611.5 - 04.515.5 %   Platelets 269 150 - 400 K/uL   nRBC 0.0 0.0 - 0.2 %  Basic metabolic panel   Collection Time: 05/03/19 10:15 AM  Result Value Ref Range   Sodium 139 135 - 145 mmol/L   Potassium 3.4 (L) 3.5 - 5.1 mmol/L   Chloride 106 98 - 111 mmol/L   CO2 24 22 - 32 mmol/L   Glucose, Bld 103 (H) 70 - 99 mg/dL   BUN 10 6 - 20 mg/dL   Creatinine, Ser 4.090.90 0.44 - 1.00 mg/dL   Calcium 8.6 (L) 8.9 - 10.3 mg/dL   GFR calc non Af Amer >60 >60 mL/min   GFR calc Af Amer >60 >60 mL/min   Anion gap 9 5 - 15  Type and screen [facility]   Collection Time: 05/03/19 10:15 AM  Result Value Ref Range    ABO/RH(D) O POS    Antibody Screen NEG    Sample Expiration      05/06/2019,2359 Performed at [facility], [address] [place], [place] [id number]   Results for orders placed or performed during the hospital encounter of 05/02/19 (from the past 336 hour(s))  SARS Coronavirus 2 (CEPHEID - Performed in [facility]), [facility] Order   Collection Time: 05/02/19 10:57 AM  Result Value Ref Range   SARS Coronavirus 2 NEGATIVE NEGATIVE  Results for orders placed or performed in visit on 04/19/19 (from the past 336 hour(s))  HCG, Tumor Marker   Collection Time: 04/19/19  1:42 PM  Result Value Ref Range   hCG Quant <1 mIU/mL    Imaging Studies: [place] Pelvis (transabdominal Only)  Addendum Date: 04/15/2019   I have reviewed this study and agree with documented findings. Hildred LaserAnika Cherry, MD [facility]   Result Date: 04/15/2019 Patient Name: Caroline Fletcher DOB: Feb 12, 1986 MRN: [id number] ULTRASOUND REPORT Location: [facility] Date of Service: 04/13/2019 Indications:Pelvic Pain Findings: The uterus is anteverted and measures 10.3 x 5.9 x 7.2. Echo texture is homogenous without evidence of focal masses. The Endometrium measures 19 mm. Right Ovary not visualized. Left Ovary measures 2.4 x 1.5 x 2.4 cm. It is normal in appearance. Survey of the adnexa demonstrates no adnexal masses. There is no free fluid in the cul de sac. Impression: 1. Thickened endometrium measuring 19mm.No change from prior US on 04/10/2019. Recommendations: 1.Clinical correlation with the patient's History and Physical Exam. Jenine M. Marciano SequinAlessi    RDMS  [place] Pelvis (transabdominal Only)  Result Date: 04/10/2019 Patient Name: Caroline Fletcher DOB: Feb 12, 1986 MRN: [id number] ULTRASOUND REPORT Location: [facility] Date of Service: 04/05/2019 Indications:Pelvic Pain Findings: The uterus is anteverted and measures 11.6 X 6.0 X 7.4 CM. Echo texture is homogenous without evidence of focal masses. * The  Endometrium measures 18 mm. Right Ovary measures 2.8 X 3.0 X 1.8 cm. It is normal in appearance. Left Ovary measures 3.2 X 2.1 X 2.3 cm. It is normal in appearance. Survey of the adnexa demonstrates no adnexal masses. There is no free fluid in the cul de sac. Impression: 1. Anteverted uterus without evidence of focal masses. 2. Bilateral ovaries are well visualized and are WNL at this time. Recommendations: 1.Clinical correlation with the patient's History and Physical Exam. Jenine M. Marciano SequinAlessi  RDMS I have reviewed this study and agree with documented findings. Rubie Maid, MD [facility]    Assessment: Patient Active Problem List   Diagnosis Date Noted  . Hypertension 03/16/2018  . Obesity (BMI 35.0-39.9 without comorbidity) 01/06/2018  . Oligomenorrhea 09/08/2017  . Asthma, chronic 06/15/2013  . TOURETTE'S DISORDER ([facility]) 03/07/2008    Plan: Patient will undergo surgical management with D&C hysteroscopy, placement of IUD, and diagnostic laparoscopy with removal of adnexal mass.   The risks of surgery were discussed in detail with the patient including but not limited to: bleeding which may require transfusion or reoperation; infection which may require antibiotics; injury to surrounding organs which may involve bowel, bladder, ureters ; need for additional procedures including laparoscopy or laparotomy; removal of fallopian tube,  thromboembolic phenomenon, surgical site problems and other postoperative/anesthesia complications. Likelihood of success in alleviating the patient's condition was discussed. Routine postoperative instructions will be reviewed with the patient and her family in detail after surgery.  The patient concurred with the proposed plan, giving informed written consent for the surgery.  Patient has been NPO since last night she will remain NPO for procedure.  Anesthesia and OR aware.  Preoperative prophylactic meds and SCDs ordered on call to the OR.  To OR when  ready.  ----- Larey Days, MD, [place] Attending Obstetrician and Gynecologist [facility], Department of [facility]

## 2019-05-03 ENCOUNTER — Encounter
Admission: RE | Disposition: A | Discharge status: Home / Self Care | Payer: Self-pay | Source: Home / Self Care | Attending: Obstetrics & Gynecology

## 2019-05-03 ENCOUNTER — Ambulatory Visit
Admission: RE | Admit: 2019-05-03 | Discharge: 2019-05-03 | Disposition: A | Discharge status: Home / Self Care | Payer: BC Managed Care – PPO | Attending: Obstetrics & Gynecology | Admitting: Obstetrics & Gynecology

## 2019-05-03 ENCOUNTER — Ambulatory Visit: Payer: BC Managed Care – PPO | Admitting: Registered Nurse

## 2019-05-03 ENCOUNTER — Encounter: Payer: Self-pay | Admitting: Certified Registered Nurse Anesthetist

## 2019-05-03 DIAGNOSIS — Z30430 Encounter for insertion of intrauterine contraceptive device: Secondary | ICD-10-CM | POA: Insufficient documentation

## 2019-05-03 DIAGNOSIS — J45909 Unspecified asthma, uncomplicated: Secondary | ICD-10-CM | POA: Insufficient documentation

## 2019-05-03 DIAGNOSIS — N840 Polyp of corpus uteri: Secondary | ICD-10-CM | POA: Diagnosis not present

## 2019-05-03 DIAGNOSIS — N879 Dysplasia of cervix uteri, unspecified: Secondary | ICD-10-CM | POA: Diagnosis not present

## 2019-05-03 DIAGNOSIS — R9389 Abnormal findings on diagnostic imaging of other specified body structures: Secondary | ICD-10-CM | POA: Diagnosis not present

## 2019-05-03 DIAGNOSIS — E669 Obesity, unspecified: Secondary | ICD-10-CM | POA: Diagnosis not present

## 2019-05-03 DIAGNOSIS — I10 Essential (primary) hypertension: Secondary | ICD-10-CM | POA: Insufficient documentation

## 2019-05-03 DIAGNOSIS — N949 Unspecified condition associated with female genital organs and menstrual cycle: Secondary | ICD-10-CM | POA: Diagnosis not present

## 2019-05-03 DIAGNOSIS — R102 Pelvic and perineal pain: Secondary | ICD-10-CM | POA: Diagnosis not present

## 2019-05-03 DIAGNOSIS — Z6841 Body Mass Index (BMI) 40.0 and over, adult: Secondary | ICD-10-CM | POA: Diagnosis not present

## 2019-05-03 DIAGNOSIS — F952 Tourette's disorder: Secondary | ICD-10-CM | POA: Diagnosis not present

## 2019-05-03 DIAGNOSIS — Z793 Long term (current) use of hormonal contraceptives: Secondary | ICD-10-CM | POA: Insufficient documentation

## 2019-05-03 DIAGNOSIS — N841 Polyp of cervix uteri: Secondary | ICD-10-CM | POA: Insufficient documentation

## 2019-05-03 DIAGNOSIS — N939 Abnormal uterine and vaginal bleeding, unspecified: Secondary | ICD-10-CM | POA: Diagnosis not present

## 2019-05-03 DIAGNOSIS — Z79899 Other long term (current) drug therapy: Secondary | ICD-10-CM | POA: Insufficient documentation

## 2019-05-03 DIAGNOSIS — N938 Other specified abnormal uterine and vaginal bleeding: Secondary | ICD-10-CM | POA: Diagnosis not present

## 2019-05-03 DIAGNOSIS — N971 Female infertility of tubal origin: Secondary | ICD-10-CM | POA: Diagnosis not present

## 2019-05-03 DIAGNOSIS — N838 Other noninflammatory disorders of ovary, fallopian tube and broad ligament: Secondary | ICD-10-CM | POA: Diagnosis not present

## 2019-05-03 HISTORY — PX: HYSTEROSCOPY W/D&C: SHX1775

## 2019-05-03 HISTORY — PX: INTRAUTERINE DEVICE (IUD) INSERTION: SHX5877

## 2019-05-03 HISTORY — PX: LAPAROSCOPY: SHX197

## 2019-05-03 HISTORY — PX: CHROMOPERTUBATION: SHX6288

## 2019-05-03 LAB — BASIC METABOLIC PANEL
Anion gap: 9 (ref 5–15)
BUN: 10 mg/dL (ref 6–20)
CO2: 24 mmol/L (ref 22–32)
Calcium: 8.6 mg/dL — ABNORMAL LOW (ref 8.9–10.3)
Chloride: 106 mmol/L (ref 98–111)
Creatinine, Ser: 0.9 mg/dL (ref 0.44–1.00)
GFR calc Af Amer: >60 mL/min (ref >60)
GFR calc non Af Amer: >60 mL/min (ref >60)
Glucose, Bld: 103 mg/dL — ABNORMAL HIGH (ref 70–99)
Potassium: 3.4 mmol/L — ABNORMAL LOW (ref 3.5–5.1)
Sodium: 139 mmol/L (ref 135–145)

## 2019-05-03 LAB — CBC
HCT: 33.7 % — ABNORMAL LOW (ref 36.0–46.0)
Hemoglobin: 11.3 g/dL — ABNORMAL LOW (ref 12.0–15.0)
MCH: 28 pg (ref 26.0–34.0)
MCHC: 33.5 g/dL (ref 30.0–36.0)
MCV: 83.4 fL (ref 80.0–100.0)
Platelets: 269 10*3/uL (ref 150–400)
RBC: 4.04 MIL/uL (ref 3.87–5.11)
RDW: 11.9 % (ref 11.5–15.5)
WBC: 9.1 10*3/uL (ref 4.0–10.5)
nRBC: 0 % (ref 0.0–0.2)

## 2019-05-03 LAB — POCT PREGNANCY, URINE: Preg Test, Ur: NEGATIVE

## 2019-05-03 LAB — TYPE AND SCREEN
ABO/RH(D): O POS
Antibody Screen: NEGATIVE

## 2019-05-03 SURGERY — DILATATION AND CURETTAGE /HYSTEROSCOPY
Anesthesia: General

## 2019-05-03 MED ORDER — PROPOFOL 500 MG/50ML IV EMUL
INTRAVENOUS | Status: DC | PRN
  Administered 2019-05-03: 30 ug/kg/min via INTRAVENOUS

## 2019-05-03 MED ORDER — DEXAMETHASONE SODIUM PHOSPHATE 10 MG/ML IJ SOLN
INTRAMUSCULAR | Status: AC
  Administered 2019-05-03: 4 mg via INTRAVENOUS
  Filled 2019-05-03: qty 1

## 2019-05-03 MED ORDER — ROCURONIUM BROMIDE 50 MG/5ML IV SOLN
INTRAVENOUS | Status: AC
  Filled 2019-05-03: qty 1

## 2019-05-03 MED ORDER — PROMETHAZINE HCL 25 MG/ML IJ SOLN: 6.2500 mg | INTRAMUSCULAR | Status: DC | PRN

## 2019-05-03 MED ORDER — MEPERIDINE HCL 50 MG/ML IJ SOLN: 6.2500 mg | INTRAMUSCULAR | Status: DC | PRN

## 2019-05-03 MED ORDER — SUGAMMADEX SODIUM 200 MG/2ML IV SOLN
INTRAVENOUS | Status: AC
  Filled 2019-05-03: qty 2

## 2019-05-03 MED ORDER — ACETAMINOPHEN 325 MG PO TABS: 650.0000 mg | ORAL_TABLET | ORAL | Status: DC | PRN

## 2019-05-03 MED ORDER — ACETAMINOPHEN 500 MG PO TABS
1000.0000 mg | ORAL_TABLET | ORAL | Status: AC
  Administered 2019-05-03: 1000 mg via ORAL

## 2019-05-03 MED ORDER — KETOROLAC TROMETHAMINE 30 MG/ML IJ SOLN: 30.0000 mg | Freq: Four times a day (QID) | INTRAMUSCULAR | Status: DC

## 2019-05-03 MED ORDER — KETOROLAC TROMETHAMINE 30 MG/ML IJ SOLN
INTRAMUSCULAR | Status: AC
  Administered 2019-05-03: 30 mg
  Filled 2019-05-03: qty 1

## 2019-05-03 MED ORDER — FENTANYL CITRATE (PF) 100 MCG/2ML IJ SOLN: 25.0000 ug | INTRAMUSCULAR | Status: DC | PRN

## 2019-05-03 MED ORDER — ACETAMINOPHEN 500 MG PO TABS
ORAL_TABLET | ORAL | Status: AC
  Administered 2019-05-03: 1000 mg via ORAL
  Filled 2019-05-03: qty 2

## 2019-05-03 MED ORDER — SCOPOLAMINE 1 MG/3DAYS TD PT72
MEDICATED_PATCH | TRANSDERMAL | Status: AC
  Administered 2019-05-03: 1.5 mg via TRANSDERMAL
  Filled 2019-05-03: qty 1

## 2019-05-03 MED ORDER — SILVER NITRATE-POT NITRATE 75-25 % EX MISC
CUTANEOUS | Status: DC | PRN
  Administered 2019-05-03: 2 via TOPICAL

## 2019-05-03 MED ORDER — ONDANSETRON HCL 4 MG/2ML IJ SOLN
INTRAMUSCULAR | Status: AC
  Filled 2019-05-03: qty 2

## 2019-05-03 MED ORDER — ACETAMINOPHEN 650 MG RE SUPP: 650.0000 mg | RECTAL | Status: DC | PRN

## 2019-05-03 MED ORDER — DEXAMETHASONE SODIUM PHOSPHATE 10 MG/ML IJ SOLN
4.0000 mg | INTRAMUSCULAR | Status: AC
  Administered 2019-05-03: 10:00:00 4 mg via INTRAVENOUS

## 2019-05-03 MED ORDER — MIDAZOLAM HCL 2 MG/2ML IJ SOLN
INTRAMUSCULAR | Status: AC
  Filled 2019-05-03: qty 2

## 2019-05-03 MED ORDER — GLYCOPYRROLATE 0.2 MG/ML IJ SOLN
INTRAMUSCULAR | Status: DC | PRN
  Administered 2019-05-03: 0.2 mg via INTRAVENOUS

## 2019-05-03 MED ORDER — FENTANYL CITRATE (PF) 100 MCG/2ML IJ SOLN
INTRAMUSCULAR | Status: AC
  Filled 2019-05-03: qty 2

## 2019-05-03 MED ORDER — IBUPROFEN 800 MG PO TABS: 800.0000 mg | ORAL_TABLET | Freq: Four times a day (QID) | ORAL | 0 refills | Status: DC

## 2019-05-03 MED ORDER — SCOPOLAMINE 1 MG/3DAYS TD PT72
1.0000 | MEDICATED_PATCH | TRANSDERMAL | Status: DC
  Administered 2019-05-03: 1.5 mg via TRANSDERMAL

## 2019-05-03 MED ORDER — CELECOXIB 200 MG PO CAPS
400.0000 mg | ORAL_CAPSULE | ORAL | Status: AC
  Administered 2019-05-03: 400 mg via ORAL

## 2019-05-03 MED ORDER — LIDOCAINE HCL (PF) 2 % IJ SOLN
INTRAMUSCULAR | Status: AC
  Filled 2019-05-03: qty 10

## 2019-05-03 MED ORDER — PROPOFOL 10 MG/ML IV BOLUS
INTRAVENOUS | Status: AC
  Filled 2019-05-03: qty 20

## 2019-05-03 MED ORDER — PROPOFOL 10 MG/ML IV BOLUS
INTRAVENOUS | Status: DC | PRN
  Administered 2019-05-03: 200 mg via INTRAVENOUS

## 2019-05-03 MED ORDER — PHENYLEPHRINE HCL (PRESSORS) 10 MG/ML IV SOLN
INTRAVENOUS | Status: DC | PRN
  Administered 2019-05-03: 100 ug via INTRAVENOUS
  Administered 2019-05-03: 50 ug via INTRAVENOUS

## 2019-05-03 MED ORDER — MIDAZOLAM HCL 2 MG/2ML IJ SOLN
INTRAMUSCULAR | Status: DC | PRN
  Administered 2019-05-03: 2 mg via INTRAVENOUS

## 2019-05-03 MED ORDER — LIDOCAINE HCL (CARDIAC) PF 100 MG/5ML IV SOSY
PREFILLED_SYRINGE | INTRAVENOUS | Status: DC | PRN
  Administered 2019-05-03: 60 mg via INTRAVENOUS

## 2019-05-03 MED ORDER — 0.9 % SODIUM CHLORIDE (POUR BTL) OPTIME
TOPICAL | Status: DC | PRN
  Administered 2019-05-03: 14:00:00 2341 mL

## 2019-05-03 MED ORDER — LACTATED RINGERS IV SOLN: INTRAVENOUS | Status: DC

## 2019-05-03 MED ORDER — OXYCODONE HCL 5 MG/5ML PO SOLN: 5.0000 mg | Freq: Once | ORAL | Status: DC | PRN

## 2019-05-03 MED ORDER — GABAPENTIN 300 MG PO CAPS
600.0000 mg | ORAL_CAPSULE | ORAL | Status: AC
  Administered 2019-05-03: 600 mg via ORAL

## 2019-05-03 MED ORDER — LACTATED RINGERS IV SOLN
INTRAVENOUS | Status: DC | PRN
  Administered 2019-05-03 (×2): via INTRAVENOUS

## 2019-05-03 MED ORDER — GABAPENTIN 300 MG PO CAPS
ORAL_CAPSULE | ORAL | Status: AC
  Administered 2019-05-03: 10:00:00 600 mg via ORAL
  Filled 2019-05-03: qty 2

## 2019-05-03 MED ORDER — BUPIVACAINE HCL (PF) 0.5 % IJ SOLN
INTRAMUSCULAR | Status: AC
  Filled 2019-05-03: qty 30

## 2019-05-03 MED ORDER — ENSURE PRE-SURGERY PO LIQD
296.0000 mL | Freq: Once | ORAL | Status: DC
  Filled 2019-05-03: qty 296

## 2019-05-03 MED ORDER — METHYLENE BLUE 0.5 % INJ SOLN
INTRAVENOUS | Status: AC
  Filled 2019-05-03: qty 10

## 2019-05-03 MED ORDER — MORPHINE SULFATE (PF) 4 MG/ML IV SOLN: 1.0000 mg | INTRAVENOUS | Status: DC | PRN

## 2019-05-03 MED ORDER — PROPOFOL 500 MG/50ML IV EMUL
INTRAVENOUS | Status: AC
  Filled 2019-05-03: qty 50

## 2019-05-03 MED ORDER — OXYCODONE HCL 5 MG PO TABS: 5.0000 mg | ORAL_TABLET | Freq: Once | ORAL | Status: DC | PRN

## 2019-05-03 MED ORDER — ONDANSETRON HCL 4 MG/2ML IJ SOLN
INTRAMUSCULAR | Status: DC | PRN
  Administered 2019-05-03: 4 mg via INTRAVENOUS

## 2019-05-03 MED ORDER — FENTANYL CITRATE (PF) 100 MCG/2ML IJ SOLN
INTRAMUSCULAR | Status: DC | PRN
  Administered 2019-05-03: 50 ug via INTRAVENOUS
  Administered 2019-05-03 (×3): 25 ug via INTRAVENOUS

## 2019-05-03 MED ORDER — CELECOXIB 200 MG PO CAPS
ORAL_CAPSULE | ORAL | Status: AC
  Administered 2019-05-03: 10:00:00 400 mg via ORAL
  Filled 2019-05-03: qty 2

## 2019-05-03 MED ORDER — SUGAMMADEX SODIUM 200 MG/2ML IV SOLN
INTRAVENOUS | Status: DC | PRN
  Administered 2019-05-03: 200 mg via INTRAVENOUS

## 2019-05-03 MED ORDER — SILVER NITRATE-POT NITRATE 75-25 % EX MISC
CUTANEOUS | Status: AC
  Filled 2019-05-03: qty 1

## 2019-05-03 MED ORDER — ROCURONIUM BROMIDE 100 MG/10ML IV SOLN
INTRAVENOUS | Status: DC | PRN
  Administered 2019-05-03 (×2): 10 mg via INTRAVENOUS
  Administered 2019-05-03: 20 mg via INTRAVENOUS
  Administered 2019-05-03: 30 mg via INTRAVENOUS
  Administered 2019-05-03: 10 mg via INTRAVENOUS

## 2019-05-03 MED ORDER — OXYCODONE HCL 5 MG PO TABS: 5.0000 mg | ORAL_TABLET | Freq: Three times a day (TID) | ORAL | 0 refills | Status: DC | PRN

## 2019-05-03 SURGICAL SUPPLY — 55 items
BAG URINE DRAINAGE (UROLOGICAL SUPPLIES) ×4 IMPLANT
BLADE SURG SZ11 CARB STEEL (BLADE) ×4 IMPLANT
CANISTER SUCT 1200ML W/VALVE (MISCELLANEOUS) ×4 IMPLANT
CATH FOLEY 2WAY  5CC 16FR (CATHETERS) ×2
CATH ROBINSON RED A/P 16FR (CATHETERS) ×4 IMPLANT
CATH URTH 16FR FL 2W BLN LF (CATHETERS) ×2 IMPLANT
CHLORAPREP W/TINT 26 (MISCELLANEOUS) ×4 IMPLANT
COVER WAND RF STERILE (DRAPES) ×4 IMPLANT
DERMABOND ADVANCED (GAUZE/BANDAGES/DRESSINGS)
DERMABOND ADVANCED .7 DNX12 (GAUZE/BANDAGES/DRESSINGS) IMPLANT
DEVICE MYOSURE LITE (MISCELLANEOUS) IMPLANT
DEVICE MYOSURE REACH (MISCELLANEOUS) IMPLANT
DRAPE LEGGINS SURG 28X43 STRL (DRAPES) ×4 IMPLANT
DRAPE UNDER BUTTOCK W/FLU (DRAPES) ×4 IMPLANT
DRSG TELFA 3X8 NADH (GAUZE/BANDAGES/DRESSINGS) ×4 IMPLANT
DRSG TELFA 4X3 1S NADH ST (GAUZE/BANDAGES/DRESSINGS) ×4 IMPLANT
ELECT REM PT RETURN 9FT ADLT (ELECTROSURGICAL) ×4
ELECTRODE REM PT RTRN 9FT ADLT (ELECTROSURGICAL) ×2 IMPLANT
GAUZE 4X4 16PLY RFD (DISPOSABLE) ×4 IMPLANT
GLOVE PI ORTHOPRO 6.5 (GLOVE) ×2
GLOVE PI ORTHOPRO STRL 6.5 (GLOVE) ×2 IMPLANT
GLOVE SURG SYN 6.5 ES PF (GLOVE) ×8 IMPLANT
GOWN STRL REUS W/ TWL LRG LVL3 (GOWN DISPOSABLE) ×4 IMPLANT
GOWN STRL REUS W/ TWL XL LVL3 (GOWN DISPOSABLE) ×2 IMPLANT
GOWN STRL REUS W/TWL LRG LVL3 (GOWN DISPOSABLE) ×4
GOWN STRL REUS W/TWL XL LVL3 (GOWN DISPOSABLE) ×2
IRRIGATION STRYKERFLOW (MISCELLANEOUS) IMPLANT
IRRIGATOR STRYKERFLOW (MISCELLANEOUS)
IV LACTATED RINGERS 1000ML (IV SOLUTION) ×4 IMPLANT
KIT PINK PAD W/HEAD ARE REST (MISCELLANEOUS) ×4
KIT PINK PAD W/HEAD ARM REST (MISCELLANEOUS) ×2 IMPLANT
KIT PROCEDURE FLUENT (KITS) ×4 IMPLANT
KIT TURNOVER CYSTO (KITS) ×4 IMPLANT
LABEL OR SOLS (LABEL) IMPLANT
LIGASURE VESSEL 5MM BLUNT TIP (ELECTROSURGICAL) IMPLANT
MANIPULATOR UTERINE 4.5 ZUMI (MISCELLANEOUS) ×4 IMPLANT
NS IRRIG 500ML POUR BTL (IV SOLUTION) ×4 IMPLANT
PACK DNC HYST (MISCELLANEOUS) ×4 IMPLANT
PACK LAP CHOLECYSTECTOMY (MISCELLANEOUS) ×4 IMPLANT
PAD OB MATERNITY 4.3X12.25 (PERSONAL CARE ITEMS) ×4 IMPLANT
PAD PREP 24X41 OB/GYN DISP (PERSONAL CARE ITEMS) ×4 IMPLANT
SEAL ROD LENS SCOPE MYOSURE (ABLATOR) ×4 IMPLANT
SET TUBE SMOKE EVAC HIGH FLOW (TUBING) ×4 IMPLANT
SLEEVE ENDOPATH XCEL 5M (ENDOMECHANICALS) ×8 IMPLANT
SOL .9 NS 3000ML IRR  AL (IV SOLUTION) ×2
SOL .9 NS 3000ML IRR UROMATIC (IV SOLUTION) ×2 IMPLANT
SUT MNCRL 4-0 (SUTURE) ×2
SUT MNCRL 4-0 27XMFL (SUTURE) ×2
SUT MNCRL AB 4-0 PS2 18 (SUTURE) ×4 IMPLANT
SUTURE MNCRL 4-0 27XMF (SUTURE) ×2 IMPLANT
TOWEL OR 17X26 4PK STRL BLUE (TOWEL DISPOSABLE) ×4 IMPLANT
TROCAR XCEL NON-BLD 5MMX100MML (ENDOMECHANICALS) IMPLANT
TUBING CONNECTING 10 (TUBING) ×3 IMPLANT
TUBING CONNECTING 10' (TUBING) ×1
liletta intrauterine device ×4 IMPLANT

## 2019-05-03 NOTE — Anesthesia Post-op Follow-up Note (Signed)
Anesthesia QCDR form completed.        

## 2019-05-03 NOTE — Transfer of Care (Signed)
Immediate Anesthesia Transfer of Care Note  Patient: Caroline Fletcher  Procedure(s) Performed: DILATATION AND CURETTAGE /HYSTEROSCOPY (N/A ) INTRAUTERINE DEVICE (IUD) INSERTION (N/A ) LAPAROSCOPY DIAGNOSTIC (N/A ) CHROMOPERTUBATION  Patient Location: PACU  Anesthesia Type:General  Level of Consciousness: sedated  Airway & Oxygen Therapy: Patient Spontanous Breathing and Patient connected to face mask oxygen  Post-op Assessment: Report given to RN and Post -op Vital signs reviewed and stable  Post vital signs: Reviewed and stable  Last Vitals:  Vitals Value Taken Time  BP 100/60 05/03/2019  3:07 PM  Temp 36.6 C 05/03/2019  3:06 PM  Pulse 90 05/03/2019  3:07 PM  Resp 20 05/03/2019  3:07 PM  SpO2 100 % 05/03/2019  3:07 PM  Vitals shown include unvalidated device data.  Last Pain:  Vitals:   05/03/19 1506  TempSrc:   PainSc: 0-No pain         Complications: No apparent anesthesia complications

## 2019-05-03 NOTE — Anesthesia Preprocedure Evaluation (Signed)
Anesthesia Evaluation  Patient identified by MRN, date of birth, ID band Patient awake    Reviewed: Allergy & Precautions, NPO status , Patient's Chart, lab work & pertinent test results  History of Anesthesia Complications Negative for: history of anesthetic complications  Airway Mallampati: II  TM Distance: >3 FB Neck ROM: Full    Dental no notable dental hx.    Pulmonary asthma , neg sleep apnea,    breath sounds clear to auscultation- rhonchi (-) wheezing      Cardiovascular hypertension, Pt. 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 Tonye Royalty   Reproductive/Obstetrics                             Anesthesia Physical Anesthesia Plan  ASA: II  Anesthesia Plan: General   Post-op Pain Management:    Induction: Intravenous  PONV Risk Score and Plan: 2 and Ondansetron, Dexamethasone and Midazolam  Airway Management Planned: Oral ETT  Additional Equipment:   Intra-op Plan:   Post-operative Plan: Extubation in OR  Informed Consent: I have reviewed the patients History and Physical, chart, labs and discussed the  procedure including the risks, benefits and alternatives for the proposed anesthesia with the patient or authorized representative who has indicated his/her understanding and acceptance.     Dental advisory given  Plan Discussed with: CRNA and Anesthesiologist  Anesthesia Plan Comments:         Anesthesia Quick Evaluation

## 2019-05-03 NOTE — Anesthesia Procedure Notes (Signed)
Procedure Name: Intubation Date/Time: 05/03/2019 1:03 PM Performed by: Caryl Asp, CRNA Pre-anesthesia Checklist: Patient identified, Emergency Drugs available, Suction available, Patient being monitored and Timeout performed Patient Re-evaluated:Patient Re-evaluated prior to induction Oxygen Delivery Method: Circle system utilized Preoxygenation: Pre-oxygenation with 100% oxygen Induction Type: IV induction Ventilation: Mask ventilation without difficulty Laryngoscope Size: McGraph and 3 (provider preference ) Grade View: Grade I Tube type: Oral Number of attempts: 1 Placement Confirmation: ETT inserted through vocal cords under direct vision,  positive ETCO2 and breath sounds checked- equal and bilateral Secured at: 21 cm Tube secured with: Tape

## 2019-05-03 NOTE — Discharge Instructions (Addendum)
Discharge instructions:  Call office if you have any of the following: fever >101 F, chills, shortness of breath, excessive vaginal bleeding, incision drainage or problems, leg pain or redness, or any other concerns.  You will have some cramping and some vaginal bleeding for the first day or so after surgery.  Do not drive unless you are certain you can slam on the brakes.  You may feel some pain in your upper right abdomen/rib and right shoulder.  This is from the gas in the abdomen for surgery. This will subside over time, please be patient!  Take 800mg  Ibuprofen and 1000mg  Tylenol around the clock, every 6 hours for at least the first few days.  After this you can take as needed.  This will help decrease inflammation and promote healing.  The narcotics you'll take just as needed, as they just trick your brain into thinking its not in pain.    Please don't limit yourself in terms of routine activity.  You will be able to do most things, although they may take longer to do or be a little painful.  You can do it!  Don't be a hero, but don't be a wimp either!    AMBULATORY SURGERY  DISCHARGE INSTRUCTIONS   1) The drugs that you were given will stay in your system until tomorrow so for the next 24 hours you should not:  A) Drive an automobile B) Make any legal decisions C) Drink any alcoholic beverage   2) You may resume regular meals tomorrow.  Today it is better to start with liquids and gradually work up to solid foods.  You may eat anything you prefer, but it is better to start with liquids, then soup and crackers, and gradually work up to solid foods.   3) Please notify your doctor immediately if you have any unusual bleeding, trouble breathing, redness and pain at the surgery site, drainage, fever, or pain not relieved by medication.    4) Additional Instructions:        Please contact your physician with any problems or Same Day Surgery at [phone], Monday through  Friday 6 am to 4 pm, or [place] [facility] number at [phone].

## 2019-05-03 NOTE — Op Note (Signed)
Caroline Fletcher PROCEDURE DATE: 05/03/2019  PATIENT:  Caroline Fletcher  33 y.o. female  PRE-OPERATIVE DIAGNOSIS:  adnexal mass, dysfunctional uterine bleeding, thickened endometrium  POST-OPERATIVE DIAGNOSIS:  dysfunctional uterine bleeding, thickened endometrium, patent bilateral fallopian tubes, normal pelvic anatomy  PROCEDURE:  Procedure(s): DILATATION AND CURETTAGE /HYSTEROSCOPY (N/A) INTRAUTERINE DEVICE (IUD) INSERTION (N/A) LAPAROSCOPY DIAGNOSTIC (N/A) CHROMOPERTUBATION  SURGEON:  Surgeon(s) and Role:    * Ward, Elenora Fenderhelsea C, MD - Primary  ANESTHESIA:  General via ET  I/O  Total I/O In: 1225 [P.O.:25; I.V.:1200] Out: 415 [Urine:400; Blood:15]  FINDINGS:   Bimanual: blood with clots in vault.  Large clot protruding from a dilated cerivx, 1-2cm. Normal vaginal tissues.  Normal vulva and urethra. Laparoscopic: Small amount of blood within pelvis. Top size, boggy uterus, mobile, normal ovaries and fallopian tubes bilaterally.  Normal upper abdomen. Vascular congestion of the left adnexa.  No mass identified.  No abnormal sigmoid structures or epiploica.  Normal appendix.  Patent bilateral fallopian tubes. Ureters vermiculating bilaterally. Hysteroscopic: sounds to 11cm.  Dense and irregular tissue within, no organized polyp.  Unable to initially identify uterine cavity.  Copious tissue extracted with curettage.    SPECIMEN: endometrial curettings  COMPLICATIONS: none apparent  DISPOSITION: vital signs stable to PACU   Indication for Surgery: 33 y.o. [id number] with irregular uterine bleeding, thickened endometrium on ultrasound, 6cm adnexal mass on ultrasound, and acute onset heavy uterine bleeding who presents to the OR today for intervention.  Risks of surgery were discussed with the patient including but not limited to: bleeding which may require transfusion or reoperation; infection which may require antibiotics; injury to bowel, bladder, ureters or other surrounding organs; need for  additional procedures including laparotomy, blood clot, incisional problems and other postoperative/anesthesia complications. Written informed consent was obtained.     PROCEDURE IN DETAIL:  The patient had sequential compression devices applied to her lower extremities while in the preoperative area.  She was then taken to the operating room where general anesthesia was administered.  She was placed in the dorsal lithotomy position, and was prepped and draped in a sterile manner. After a surgical timeout was performed.   A Foley catheter was inserted into her bladder and attached to constant drainage and a uterine manipulator was then advanced into the uterus.  Attention was turned to the abdomen where an umbilical incision was made with the scalpel. A 5mm trochar was inserted in the umbilical incision using a visiport method. Opening pressure was 8mmHg, and the abdomen was insufflated to 15mmHg carbon dioxide gas and adequate pneumoperitoneum was obtained.  A survey of the patient's pelvis and abdomen revealed the findings as mentioned above. One 5mm suprapubic port was inserted isuprapubically under visualization.    The pelvis was inspected and appeared normal, as described above.  The grasper was inserted into the suprapubic trochar, and used for atraumatic retraction and manipulation of the adnexa.  No mass was identified. Attempts were made of chromopertubation with methylene blue in saline.  200cc total was injected into the uterus and no spillage or dilation of the tubes were observed.   Since there was blood in the pelvis, it was assumed that there was a technical challenge.  The insufflation was stopped, and attention was turned to the vagina.    The speculum was re-inserted, and all of the fluid previously injected into the uterine manipulator was noted to be in the drape and pooled in the vagina.  The uterine manipulator was removed, and the uterus sounded. A single-tooth  tenaculum was placed  at 12:00 of the anterior cervix. The Myoscope was inserted into the cervix and advanced into the uterus, and visualization was poor - there was excessive tissue.  Curette was inserted and the tissue removed.  Myoscope was reinserted, and the uterine cavity was identified, with disrupted tissue and no evidence of masses or retained clumps of tissue. Also no evidence of singular bleeding source.  The bilateral ostea were not identified.    The pneumoperitoneum was re-created and camera inserted by scrub tech to observe second attempt to inject dye to determine tubal patency.  The uterine manipulator was again inserted and this time, the balloon was over-inflated and pulled taut against the internal cervical os.  Upon injection, no dye spilled out of the cervix, and bilateral spillage from both fallopian tubes was confirmed in the abdomen.  The camera was removed, and pneumoperitoneum was again deflated, and the uterine manipulator was deflated and removed.   The Liletta IUD was inserted in typical fashion without difficulty, and strings were trimmed to 3cm.    After a change of gloves, all skin incisions were closed with 4-0 monocryl and covered with surgical glue. The patient tolerated the procedures well.  All instruments, needles, and sponge counts were correct x 2. The patient was taken to the recovery room in stable condition.   ---- Larey Days, MD, [place] Attending Obstetrician and Gynecologist [facility]

## 2019-05-04 ENCOUNTER — Encounter: Payer: Self-pay | Admitting: Obstetrics & Gynecology

## 2019-05-04 NOTE — Anesthesia Postprocedure Evaluation (Signed)
Anesthesia Post Note  Patient: Caroline Fletcher  Procedure(s) Performed: DILATATION AND CURETTAGE /HYSTEROSCOPY (N/A ) INTRAUTERINE DEVICE (IUD) INSERTION (N/A ) LAPAROSCOPY DIAGNOSTIC (N/A ) CHROMOPERTUBATION  Patient location during evaluation: PACU Anesthesia Type: General Level of consciousness: awake and alert Pain management: pain level controlled Vital Signs Assessment: post-procedure vital signs reviewed and stable Respiratory status: spontaneous breathing, nonlabored ventilation and respiratory function stable Cardiovascular status: blood pressure returned to baseline and stable Postop Assessment: no apparent nausea or vomiting Anesthetic complications: no     Last Vitals:  Vitals:   05/03/19 1552 05/03/19 1608  BP: 122/75 133/75  Pulse: 87 79  Resp: 17 18  Temp:  (!) 36.4 C  SpO2: 99% 100%    Last Pain:  Vitals:   05/03/19 1608  TempSrc: Temporal  PainSc: 3                  Shiloh Swopes Harvie Heck

## 2019-05-05 LAB — SURGICAL PATHOLOGY

## 2019-05-31 DIAGNOSIS — N938 Other specified abnormal uterine and vaginal bleeding: Secondary | ICD-10-CM | POA: Diagnosis not present

## 2019-05-31 DIAGNOSIS — Z975 Presence of (intrauterine) contraceptive device: Secondary | ICD-10-CM | POA: Diagnosis not present

## 2019-05-31 DIAGNOSIS — Z6841 Body Mass Index (BMI) 40.0 and over, adult: Secondary | ICD-10-CM | POA: Diagnosis not present

## 2019-06-07 DIAGNOSIS — Z3202 Encounter for pregnancy test, result negative: Secondary | ICD-10-CM | POA: Diagnosis not present

## 2019-06-12 DIAGNOSIS — B9689 Other specified bacterial agents as the cause of diseases classified elsewhere: Secondary | ICD-10-CM | POA: Diagnosis not present

## 2019-06-12 DIAGNOSIS — J209 Acute bronchitis, unspecified: Secondary | ICD-10-CM | POA: Diagnosis not present

## 2019-06-12 DIAGNOSIS — J019 Acute sinusitis, unspecified: Secondary | ICD-10-CM | POA: Diagnosis not present

## 2019-06-13 DIAGNOSIS — Z03818 Encounter for observation for suspected exposure to other biological agents ruled out: Secondary | ICD-10-CM | POA: Diagnosis not present

## 2019-06-22 DIAGNOSIS — Z3202 Encounter for pregnancy test, result negative: Secondary | ICD-10-CM | POA: Diagnosis not present

## 2019-06-22 DIAGNOSIS — Z3201 Encounter for pregnancy test, result positive: Secondary | ICD-10-CM | POA: Diagnosis not present

## 2019-06-22 DIAGNOSIS — R7989 Other specified abnormal findings of blood chemistry: Secondary | ICD-10-CM | POA: Diagnosis not present

## 2019-06-27 DIAGNOSIS — R05 Cough: Secondary | ICD-10-CM | POA: Diagnosis not present

## 2019-06-27 DIAGNOSIS — J40 Bronchitis, not specified as acute or chronic: Secondary | ICD-10-CM | POA: Diagnosis not present

## 2019-06-30 DIAGNOSIS — R102 Pelvic and perineal pain: Secondary | ICD-10-CM | POA: Diagnosis not present

## 2019-07-07 DIAGNOSIS — F952 Tourette's disorder: Secondary | ICD-10-CM | POA: Diagnosis not present

## 2019-07-14 DIAGNOSIS — Z3202 Encounter for pregnancy test, result negative: Secondary | ICD-10-CM | POA: Diagnosis not present

## 2019-08-03 ENCOUNTER — Ambulatory Visit: Payer: BC Managed Care – PPO | Admitting: Family Medicine

## 2019-08-03 ENCOUNTER — Ambulatory Visit: Payer: Medicaid Other | Admitting: Family Medicine

## 2019-09-04 ENCOUNTER — Other Ambulatory Visit
Admission: RE | Admit: 2019-09-04 | Discharge: 2019-09-04 | Disposition: A | Discharge status: Home / Self Care | Payer: Medicaid Other | Source: Ambulatory Visit | Attending: Obstetrics & Gynecology | Admitting: Obstetrics & Gynecology

## 2019-09-04 DIAGNOSIS — Z3200 Encounter for pregnancy test, result unknown: Secondary | ICD-10-CM | POA: Insufficient documentation

## 2019-09-04 LAB — HCG, QUANTITATIVE, PREGNANCY: hCG, Beta Chain, Quant, S: <1 m[IU]/mL (ref <5)

## 2019-11-04 ENCOUNTER — Encounter: Payer: Self-pay | Admitting: Family Medicine

## 2019-11-06 MED ORDER — HYDROCHLOROTHIAZIDE 12.5 MG PO CAPS: ORAL_CAPSULE | ORAL | 0 refills | Status: DC

## 2019-11-06 NOTE — Telephone Encounter (Signed)
I will send in a 3 month supply, but she should find a new MD in [place].  Good luck!

## 2019-11-06 NOTE — Telephone Encounter (Signed)
Last office visit 11/24/2018.  Ok to refill?  Patient has relocated to [place], [place].

## 2019-12-28 ENCOUNTER — Telehealth: Payer: Self-pay | Admitting: Family Medicine

## 2019-12-28 NOTE — Telephone Encounter (Signed)
Patient stated she just moved back in town and would really like lab work done since it has been some time since she had them done  Patient would like to know if you could check her TSH and HCG count ?

## 2019-12-28 NOTE — Telephone Encounter (Signed)
Needs an office visit, in office  Noit seen > 1 yr

## 2020-01-01 ENCOUNTER — Encounter: Payer: Self-pay | Admitting: Family Medicine

## 2020-01-01 ENCOUNTER — Ambulatory Visit (INDEPENDENT_AMBULATORY_CARE_PROVIDER_SITE_OTHER): Payer: Medicaid Other | Admitting: Family Medicine

## 2020-01-01 ENCOUNTER — Other Ambulatory Visit: Payer: Self-pay

## 2020-01-01 VITALS — BP 160/80 | HR 83 | Temp 98.5°F | Ht 62.0 in | Wt 235.8 lb

## 2020-01-01 DIAGNOSIS — R7989 Other specified abnormal findings of blood chemistry: Secondary | ICD-10-CM

## 2020-01-01 DIAGNOSIS — N912 Amenorrhea, unspecified: Secondary | ICD-10-CM

## 2020-01-01 DIAGNOSIS — Z79899 Other long term (current) drug therapy: Secondary | ICD-10-CM

## 2020-01-01 NOTE — Progress Notes (Signed)
Jarrius Huaracha T. Myrtle Barnhard, MD Primary Care and [place] at [facility] [place], [address] Phone: [phone]  FAX: [phone]  December Hedtke - 34 y.o. female  MRN [id number]  Date of Birth: October 29, 1986  Visit Date: 01/01/2020  PCP: Owens Loffler, MD  Referred by: Owens Loffler, MD  Chief Complaint  Patient presents with  . Amenorrhea  . Follow-up    Thyroid    This visit occurred during the SARS-CoV-2 public health emergency.  Safety protocols were in place, including screening questions prior to the visit, additional usage of staff PPE, and extensive cleaning of exam room while observing appropriate contact time as indicated for disinfecting solutions.   Subjective:   Caroline Fletcher is a 34 y.o. very pleasant female patient who presents with the following:  She is a pleasant young 34 year old female and she presents with some questions regarding some lab test that she would like to have done.  She wanted to have a pregnancy test.  She wanted to have a quantitative hCG.  She also wanted to get her thyroid levels checked.  She has recently taken some pregnancy test which appeared to be positive with a faint line.  She wants to have a confirmatory blood hCG.  She did have a miscarriage last summer in June.  Amenorhea: 10/23, later had a period  Miscarriage in June Has had some + pregnancy tests  Thyroid.  Was abnormal.  While in a different location she did have some thyroid abnormalities and wanted to follow-up about this.  She is also on Abilify chronically, and we need to make sure that she has not developed any metabolic syndrome.  I reviewed with her that she does need to have some A1c and lipids checked routinely.  BP:  145 / 80  Review of Systems is noted in the HPI, as appropriate  Objective:   BP (!) 160/80   Pulse 83   Temp 98.5 F (36.9 C) (Temporal)   Ht 5\' 2"  (1.575 m)   Wt 235 lb 12 oz (106.9  kg)   LMP 09/17/2019   SpO2 98%   BMI 43.12 kg/m   GEN: No acute distress; alert,appropriate. PULM: Breathing comfortably in no respiratory distress PSYCH: Normally interactive.   CV: RRR, no m/g/r   Laboratory and Imaging Data:  Assessment and Plan:     ICD-10-CM   1. Amenorrhea  N91.2 hCG, quantitative, pregnancy  2. Abnormal thyroid blood test  R79.89 T4, free    T3, free    TSH  3. Long term current use of antipsychotic medication  Z79.899    Level of Medical Decision-Making in this case is MODERATE.   Follow-up thyroid studies.  Beta quant, pregnancy.  I did review with her additionally regarding potential metabolic syndrome with the use of antipsychotics long-term.  She should have some A1c as well as lipids checked while on these.  Follow-up: No follow-ups on file.  No orders of the defined types were placed in this encounter.  Medications Discontinued During This Encounter  Medication Reason  . ibuprofen (ADVIL) 800 MG tablet One time medication  . oxyCODONE (ROXICODONE) 5 MG immediate release tablet One time medication   Orders Placed This Encounter  Procedures  . T4, free  . T3, free  . TSH  . hCG, quantitative, pregnancy    Signed,  Natnael Biederman T. Haja Crego, MD   Outpatient Encounter Medications as of 01/01/2020  Medication Sig  . albuterol (VENTOLIN  HFA) 108 (90 Base) MCG/ACT inhaler 2 puffs q.i.d. p.r.n. short of breath, wheezing, or cough  . ARIPiprazole (ABILIFY) 2 MG tablet Take 2 mg by mouth daily.   . cloNIDine (CATAPRES) 0.1 MG tablet Take 0.1 mg by mouth daily.   . hydrochlorothiazide (MICROZIDE) 12.5 MG capsule TAKE ONE CAPSULE BY MOUTH DAILY  . [DISCONTINUED] ibuprofen (ADVIL) 800 MG tablet Take 1 tablet (800 mg total) by mouth every 6 (six) hours.  . [DISCONTINUED] oxyCODONE (ROXICODONE) 5 MG immediate release tablet Take 1 tablet (5 mg total) by mouth every 8 (eight) hours as needed.   No facility-administered encounter medications on file  as of 01/01/2020.

## 2020-01-02 ENCOUNTER — Encounter: Payer: Self-pay | Admitting: Family Medicine

## 2020-01-02 LAB — HCG, QUANTITATIVE, PREGNANCY: Quantitative HCG: <0.6 m[IU]/mL

## 2020-01-02 LAB — TSH: TSH: 6.23 u[IU]/mL — ABNORMAL HIGH (ref 0.35–4.50)

## 2020-01-02 LAB — T3, FREE: T3, Free: 3.6 pg/mL (ref 2.3–4.2)

## 2020-01-02 LAB — T4, FREE: Free T4: 0.83 ng/dL (ref 0.60–1.60)

## 2020-03-22 ENCOUNTER — Telehealth: Payer: Self-pay | Admitting: Family Medicine

## 2020-03-22 MED ORDER — HYDROCHLOROTHIAZIDE 12.5 MG PO CAPS: ORAL_CAPSULE | ORAL | 0 refills | Status: DC

## 2020-03-22 NOTE — Telephone Encounter (Signed)
Patient called requesting a refill She stated that they have moved back into the area and needs the medication sent the local pharmacy in [place] Patient is out of medication   hydrochlorothiazide (MICROZIDE) 12.5 MG capsule   [facility]- [address] [place]

## 2020-03-22 NOTE — Telephone Encounter (Signed)
Refill sent as requested. 

## 2020-04-17 ENCOUNTER — Telehealth: Payer: Self-pay | Admitting: Family Medicine

## 2020-04-17 DIAGNOSIS — F952 Tourette's disorder: Secondary | ICD-10-CM

## 2020-04-17 NOTE — Telephone Encounter (Signed)
Patient's currently seeing Dr.Jennifer [employer]. Patient wants to switch to [facility]-phone number [phone]. Patient referred by a friend who is a Careers adviser at [employer].  Patient wants to get off the medication she's taking now because it causes weight gain. Patient can go anytime.

## 2020-07-03 ENCOUNTER — Ambulatory Visit
Admission: RE | Admit: 2020-07-03 | Discharge: 2020-07-03 | Disposition: A | Discharge status: Home / Self Care | Payer: Medicaid Other | Source: Ambulatory Visit | Attending: Emergency Medicine | Admitting: Emergency Medicine

## 2020-07-03 ENCOUNTER — Other Ambulatory Visit: Payer: Self-pay

## 2020-07-03 VITALS — BP 160/84 | HR 85 | Temp 98.3°F | Resp 16

## 2020-07-03 DIAGNOSIS — J209 Acute bronchitis, unspecified: Secondary | ICD-10-CM

## 2020-07-03 MED ORDER — PREDNISONE 10 MG PO TABS: 40.0000 mg | ORAL_TABLET | Freq: Every day | ORAL | 0 refills | Status: AC

## 2020-07-03 MED ORDER — ALBUTEROL SULFATE HFA 108 (90 BASE) MCG/ACT IN AERS: 2.0000 | INHALATION_SPRAY | RESPIRATORY_TRACT | 0 refills | Status: DC | PRN

## 2020-07-03 NOTE — Discharge Instructions (Signed)
Use the albuterol inhaler and take the prednisone as directed.    Your COVID test is pending.  You should self quarantine until the test result is back.    Follow up with your primary care provider if your symptoms are not improving.

## 2020-07-03 NOTE — ED Triage Notes (Signed)
Patient reports her husband was diagnosed with bronchitis this morning. Patient here complaining of intermittent shortness of breath with exertion. States that laying down can cause it too.

## 2020-07-03 NOTE — ED Provider Notes (Signed)
[name]    CSN: [id number] Arrival date & time: 07/03/20  1353      History   Chief Complaint Chief Complaint  Patient presents with  . Shortness of Breath    HPI Caroline Fletcher is a 34 y.o. female.   Patient with history of asthma and hypertension presents with shortness of breath, sinus drainage, and nasal congestion x3 days.  She reports "heaviness" of her chest when breathing.  She states this is her usual symptom when she gets bronchitis.  She denies wheezing.  She states she recently moved and is unable to find her albuterol inhaler.  She denies fever, chills, sore throat, chest pain, cough, abdominal pain, vomiting, diarrhea, rash, or other symptoms.  The history is provided by the patient.    Past Medical History:  Diagnosis Date  . Asthma, chronic 06/15/2013  . Cleft palate   . History of abnormal cervical Pap smear 2014   LGSIL with positive HRHPV  . 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 Hypertension   . Tourette's disorder    Dr. Zola Button    Patient Active Problem List   Diagnosis Date Noted  . Hypertension 03/16/2018  . Obesity (BMI 35.0-39.9 without comorbidity) 01/06/2018  . Oligomenorrhea 09/08/2017  . Asthma, chronic 06/15/2013  . TOURETTE'S DISORDER ([facility]) 03/07/2008    Past Surgical History:  Procedure Laterality Date  . CHROMOPERTUBATION  05/03/2019   Procedure: CHROMOPERTUBATION;  Surgeon: Ward, Elenora Fender, MD;  Location: [facility];  Service: Gynecology;;  . Creig Hines  2009  . HYSTEROSCOPY WITH D & C N/A 05/03/2019   Procedure: DILATATION AND CURETTAGE /HYSTEROSCOPY;  Surgeon: Ward, Elenora Fender, MD;  Location: [facility];  Service: Gynecology;  Laterality: N/A;  . INTRAUTERINE DEVICE (IUD) INSERTION N/A 05/03/2019   Procedure: INTRAUTERINE DEVICE (IUD) INSERTION;  Surgeon: Ward, Elenora Fender, MD;  Location: [facility];  Service: Gynecology;  Laterality: N/A;  . LAPAROSCOPY N/A 05/03/2019   Procedure:  LAPAROSCOPY DIAGNOSTIC;  Surgeon: Ward, Elenora Fender, MD;  Location: [facility];  Service: Gynecology;  Laterality: N/A;  . MYRINGOTOMY     bilateral  . Pallet repair  1990    OB History    Gravida  4   Para  2   Term  2   Preterm      AB  1   Living  2     SAB  1   TAB      Ectopic      Multiple  0   Live Births  2            Home Medications    Prior to Admission medications   Medication Sig Start Date End Date Taking? Authorizing Provider  escitalopram (LEXAPRO) 10 MG tablet Take 1/2 tablet daily x 1 week, then increase to 1 tablet daily. 05/31/20  Yes [provider]  albuterol (VENTOLIN HFA) 108 (90 Base) MCG/ACT inhaler Inhale 2 puffs into the lungs every 4 (four) hours as needed for wheezing or shortness of breath. 07/03/20   Mickie Bail, NP  ARIPiprazole (ABILIFY) 2 MG tablet Take 2 mg by mouth daily.     [provider]  cloNIDine (CATAPRES) 0.1 MG tablet Take 0.1 mg by mouth daily.     [provider]  hydrochlorothiazide (MICROZIDE) 12.5 MG capsule TAKE ONE CAPSULE BY MOUTH DAILY 03/22/20   Copland, Karleen Hampshire, MD  medroxyPROGESTERone (PROVERA) 10 MG tablet Take by mouth. 02/05/20  [provider]  predniSONE (DELTASONE) 10 MG tablet Take 4 tablets (40 mg total) by mouth daily for 5 days. 07/03/20 07/08/20  Mickie Bail, NP    Family History Family History  Problem Relation Age of Onset  . Healthy Father   . Healthy Mother   . Stroke Paternal Grandmother   . Heart attack Paternal Grandmother   . Diabetes Maternal Grandmother   . Cancer Neg Hx     Social History Social History   Tobacco Use  . Smoking status: Never Smoker  . Smokeless tobacco: Never Used  Vaping Use  . Vaping Use: Never used  Substance Use Topics  . Alcohol use: No    Comment: Occasional  . Drug use: No     Allergies   Patient has no known allergies.   Review of Systems Review of Systems  Constitutional: Negative for chills and fever.    HENT: Positive for congestion and postnasal drip. Negative for ear pain and sore throat.   Eyes: Negative for pain and visual disturbance.  Respiratory: Positive for chest tightness and shortness of breath. Negative for cough.   Cardiovascular: Negative for chest pain, palpitations and leg swelling.  Gastrointestinal: Negative for abdominal pain, diarrhea and vomiting.  Genitourinary: Negative for dysuria and hematuria.  Musculoskeletal: Negative for arthralgias and back pain.  Skin: Negative for color change and rash.  Neurological: Negative for seizures, syncope, weakness and numbness.  All other systems reviewed and are negative.    Physical Exam Triage Vital Signs ED Triage Vitals  Enc Vitals Group     BP 07/03/20 1403 (!) 160/84     Pulse Rate 07/03/20 1403 85     Resp 07/03/20 1403 16     Temp 07/03/20 1403 98.3 F (36.8 C)     Temp src --      SpO2 07/03/20 1403 95 %     Weight --      Height --      Head Circumference --      Peak Flow --      Pain Score 07/03/20 1400 0     Pain Loc --      Pain Edu? --      Excl. in GC? --    No data found.  Updated Vital Signs BP (!) 160/84   Pulse 85   Temp 98.3 F (36.8 C)   Resp 16   LMP 06/19/2020 (Exact Date)   SpO2 95%   Visual Acuity Right Eye Distance:   Left Eye Distance:   Bilateral Distance:    Right Eye Near:   Left Eye Near:    Bilateral Near:     Physical Exam Vitals and nursing note reviewed.  Constitutional:      General: She is not in acute distress.    Appearance: She is well-developed. She is not ill-appearing.  HENT:     Head: Normocephalic and atraumatic.     Right Ear: Tympanic membrane normal.     Left Ear: Tympanic membrane normal.     Nose: Nose normal.     Mouth/Throat:     Mouth: Mucous membranes are moist.     Pharynx: Oropharynx is clear.  Eyes:     Conjunctiva/sclera: Conjunctivae normal.  Cardiovascular:     Rate and Rhythm: Normal rate and regular rhythm.     Heart  sounds: No murmur heard.   Pulmonary:     Effort: Pulmonary effort is normal. No respiratory distress.  Breath sounds: Normal breath sounds. No wheezing or rhonchi.  Abdominal:     Palpations: Abdomen is soft.     Tenderness: There is no abdominal tenderness. There is no guarding or rebound.  Musculoskeletal:     Cervical back: Neck supple.     Right lower leg: No edema.     Left lower leg: No edema.  Skin:    General: Skin is warm and dry.     Findings: No rash.  Neurological:     General: No focal deficit present.     Mental Status: She is alert and oriented to person, place, and time.     Gait: Gait normal.  Psychiatric:        Mood and Affect: Mood normal.        Behavior: Behavior normal.      UC Treatments / Results  Labs (all labs ordered are listed, but only abnormal results are displayed) Labs Reviewed  NOVEL CORONAVIRUS, NAA    EKG   Radiology No results found.  Procedures Procedures (including critical care time)  Medications Ordered in UC Medications - No data to display  Initial Impression / Assessment and Plan / UC Course  I have reviewed the triage vital signs and the nursing notes.  Pertinent labs & imaging results that were available during my care of the patient were reviewed by me and considered in my medical decision making (see chart for details).   Acute bronchitis.  Patient is well-appearing and her exam is reassuring.  No wheezing or respiratory distress.  Treating with albuterol inhaler and prednisone.  PCR COVID pending.  Instructed patient to self quarantine until the test result is back.  Instructed her to follow-up with her PCP if her symptoms are not improving.  Patient agrees to plan of care.   Final Clinical Impressions(s) / UC Diagnoses   Final diagnoses:  Acute bronchitis, unspecified organism     Discharge Instructions     Use the albuterol inhaler and take the prednisone as directed.    Your COVID test is pending.   You should self quarantine until the test result is back.    Follow up with your primary care provider if your symptoms are not improving.         ED Prescriptions    Medication Sig Dispense Auth. Provider   predniSONE (DELTASONE) 10 MG tablet Take 4 tablets (40 mg total) by mouth daily for 5 days. 20 tablet Mickie Bail, NP   albuterol (VENTOLIN HFA) 108 (90 Base) MCG/ACT inhaler Inhale 2 puffs into the lungs every 4 (four) hours as needed for wheezing or shortness of breath. 18 g Mickie Bail, NP     PDMP not reviewed this encounter.   Mickie Bail, NP 07/03/20 1436

## 2020-07-05 LAB — NOVEL CORONAVIRUS, NAA: SARS-CoV-2, NAA: NOT DETECTED

## 2020-07-05 LAB — SARS-COV-2, NAA 2 DAY TAT

## 2020-10-29 ENCOUNTER — Other Ambulatory Visit: Payer: Self-pay | Admitting: Certified Nurse Midwife

## 2020-10-29 DIAGNOSIS — N6315 Unspecified lump in the right breast, overlapping quadrants: Secondary | ICD-10-CM

## 2020-11-05 ENCOUNTER — Other Ambulatory Visit: Payer: Self-pay

## 2020-11-05 ENCOUNTER — Ambulatory Visit
Admission: RE | Admit: 2020-11-05 | Discharge: 2020-11-05 | Disposition: A | Discharge status: Home / Self Care | Payer: Medicaid Other | Source: Ambulatory Visit | Attending: Oncology | Admitting: Oncology

## 2020-11-05 ENCOUNTER — Ambulatory Visit: Payer: Self-pay | Attending: Oncology

## 2020-11-05 VITALS — BP 143/89 | HR 81 | Temp 99.3°F | Ht 63.5 in | Wt 225.0 lb

## 2020-11-05 DIAGNOSIS — N63 Unspecified lump in unspecified breast: Secondary | ICD-10-CM | POA: Insufficient documentation

## 2020-11-05 NOTE — Progress Notes (Signed)
  Subjective:     Patient ID: Caroline Fletcher, female   DOB: 1986-06-30, 34 y.o.   MRN: [id number]  HPI   Review of Systems     Objective:   Physical Exam Chest:  Breasts:     Right: Tenderness present. No swelling, bleeding, inverted nipple, mass, nipple discharge or skin change.     Left: No swelling, bleeding, inverted nipple, mass, nipple discharge, skin change or tenderness.        Comments: Fibroglandular tissue outer quadrants       Assessment:     34 year old patient presents for [facility] clinic visit.  Reports she felt right breast mass 1 week ago.  Saw Primary provider, who felt lump at 7 o'clock right breast and another at 11 o'clock right breast. Patient's maternal grandmother diagnosed with breast cancer at 71.   Patient screened, and meets [facility] eligibility.  Patient does not have insurance, Medicare or Medicaid. Instructed patient on breast self awareness using teach back method. Clinical breast exam reveals fibroglandular tissue bilateral outer breasts.  Patient reports associated tenderness only on right.     Plan:     Sent for bilateral diagnostic mammogram and ultrasound.

## 2020-11-11 ENCOUNTER — Other Ambulatory Visit: Payer: Medicaid Other

## 2020-11-19 ENCOUNTER — Encounter: Payer: Self-pay | Admitting: Family Medicine

## 2020-11-25 ENCOUNTER — Ambulatory Visit: Payer: Self-pay | Admitting: Family Medicine

## 2020-11-27 ENCOUNTER — Ambulatory Visit (INDEPENDENT_AMBULATORY_CARE_PROVIDER_SITE_OTHER): Payer: Self-pay | Admitting: Family Medicine

## 2020-11-27 ENCOUNTER — Other Ambulatory Visit: Payer: Self-pay

## 2020-11-27 ENCOUNTER — Encounter: Payer: Self-pay | Admitting: Family Medicine

## 2020-11-27 VITALS — BP 118/70 | HR 84 | Temp 98.0°F | Ht 62.0 in | Wt 219.5 lb

## 2020-11-27 DIAGNOSIS — Z79899 Other long term (current) drug therapy: Secondary | ICD-10-CM

## 2020-11-27 DIAGNOSIS — R635 Abnormal weight gain: Secondary | ICD-10-CM

## 2020-11-27 DIAGNOSIS — N912 Amenorrhea, unspecified: Secondary | ICD-10-CM

## 2020-11-27 DIAGNOSIS — Z1159 Encounter for screening for other viral diseases: Secondary | ICD-10-CM

## 2020-11-27 NOTE — Progress Notes (Signed)
Aiva Miskell T. Mohamud Mrozek, MD, CAQ Sports Medicine  Primary Care and Sports Medicine [facility] at [facility] [address] [place], [address]  Phone: [phone]  FAX: [phone]  Caroline Fletcher - 35 y.o. female  MRN [id number]  Date of Birth: Jun 15, 1986  Date: 11/27/2020  PCP: Hannah Beat, MD  Referral: Hannah Beat, MD  Chief Complaint  Patient presents with  . Blood work for neurologist    This visit occurred during the SARS-CoV-2 public health emergency.  Safety protocols were in place, including screening questions prior to the visit, additional usage of staff PPE, and extensive cleaning of exam room while observing appropriate contact time as indicated for disinfecting solutions.   Subjective:   Caroline Fletcher is a 35 y.o. very pleasant female patient with Body mass index is 40.15 kg/m. who presents with the following:  Questions from Neurologist: She would like to start her on Topamax.  She is currently on Lexapro 20 mg.  She has been on various other agents for Tourette's syndrome.  Most recently she was on Abilify and she gained approximately 50 pounds.  She has had a resurgence of her tics in the last several weeks.  She does have a follow-up appointment with her neurologist in 2 weeks time.  She has not had basic labs in about a year, and she needs to have confirmed not pregnancy to begin taking Topamax.  She reports that she has been amenorrheic for 6 months.  B HCG Full baseline labs Thyroid  Tics have gotten a lot worse  Wanted to try topamax?  No menses in 6 months  Review of Systems is noted in the HPI, as appropriate  Objective:   BP 118/70   Pulse 84   Temp 98 F (36.7 C) (Temporal)   Ht 5\' 2"  (1.575 m)   Wt 219 lb 8 oz (99.6 kg)   LMP 05/20/2020   SpO2 95%   BMI 40.15 kg/m   GEN: No acute distress; alert,appropriate. PULM: Breathing comfortably in no respiratory distress PSYCH: Normally interactive.   CV: RRR, no m/g/r   Laboratory and Imaging Data:  Assessment and Plan:     ICD-10-CM   1. Amenorrhea  N91.2 hCG, quantitative, pregnancy  2. Weight gain  R63.5 TSH    T4, free    T3, free  3. Long term current use of antipsychotic medication  Z79.899 Basic metabolic panel    CBC with Differential/Platelet    Hepatic function panel    Lipid panel    TSH    T4, free    T3, free    Hemoglobin A1c  4. Need for hepatitis C screening test  Z11.59 Hepatitis C antibody   Basic lab work.  Beta-hCG quantitative to rule out pregnancy.  Significant weight gain while on Abilify.  Check basic labs that can relate to this including worsening of blood sugar as well as hyperlipidemia and metabolic syndrome.  Basic hepatitis C screening, following CDC recommendations.   No orders of the defined types were placed in this encounter.  Medications Discontinued During This Encounter  Medication Reason  . ARIPiprazole (ABILIFY) 2 MG tablet Completed Course  . hydrochlorothiazide (MICROZIDE) 12.5 MG capsule Completed Course  . medroxyPROGESTERone (PROVERA) 10 MG tablet Completed Course   Orders Placed This Encounter  Procedures  . hCG, quantitative, pregnancy  . Basic metabolic panel  . CBC with Differential/Platelet  . Hepatic function panel  . Lipid panel  . TSH  . T4, free  .  T3, free  . Hepatitis C antibody  . Hemoglobin A1c    Follow-up: No follow-ups on file.  Signed,  Elpidio Galea. Ezri Landers, MD   Outpatient Encounter Medications as of 11/27/2020  Medication Sig  . albuterol (VENTOLIN HFA) 108 (90 Base) MCG/ACT inhaler Inhale 2 puffs into the lungs every 4 (four) hours as needed for wheezing or shortness of breath.  . cloNIDine (CATAPRES) 0.1 MG tablet Take 0.1 mg by mouth daily.   Marland Kitchen escitalopram (LEXAPRO) 10 MG tablet Take 1/2 tablet daily x 1 week, then increase to 1 tablet daily.  . [DISCONTINUED] medroxyPROGESTERone (PROVERA) 10 MG tablet Take by mouth.  . [DISCONTINUED]  ARIPiprazole (ABILIFY) 2 MG tablet Take 2 mg by mouth daily.   . [DISCONTINUED] hydrochlorothiazide (MICROZIDE) 12.5 MG capsule TAKE ONE CAPSULE BY MOUTH DAILY   No facility-administered encounter medications on file as of 11/27/2020.

## 2020-11-28 LAB — CBC WITH DIFFERENTIAL/PLATELET
Basophils Absolute: 0.1 10*3/uL (ref 0.0–0.1)
Basophils Relative: 1.2 % (ref 0.0–3.0)
Eosinophils Absolute: 0.2 10*3/uL (ref 0.0–0.7)
Eosinophils Relative: 2 % (ref 0.0–5.0)
HCT: 39.5 % (ref 36.0–46.0)
Hemoglobin: 13.4 g/dL (ref 12.0–15.0)
Lymphocytes Relative: 26.8 % (ref 12.0–46.0)
Lymphs Abs: 2.1 10*3/uL (ref 0.7–4.0)
MCHC: 33.9 g/dL (ref 30.0–36.0)
MCV: 82.9 fl (ref 78.0–100.0)
Monocytes Absolute: 0.5 10*3/uL (ref 0.1–1.0)
Monocytes Relative: 7.2 % (ref 3.0–12.0)
Neutro Abs: 4.8 10*3/uL (ref 1.4–7.7)
Neutrophils Relative %: 62.8 % (ref 43.0–77.0)
Platelets: 278 10*3/uL (ref 150.0–400.0)
RBC: 4.76 Mil/uL (ref 3.87–5.11)
RDW: 13.1 % (ref 11.5–15.5)
WBC: 7.7 10*3/uL (ref 4.0–10.5)

## 2020-11-28 LAB — BASIC METABOLIC PANEL
BUN: 10 mg/dL (ref 6–23)
CO2: 29 mEq/L (ref 19–32)
Calcium: 9.4 mg/dL (ref 8.4–10.5)
Chloride: 104 mEq/L (ref 96–112)
Creatinine, Ser: 0.89 mg/dL (ref 0.40–1.20)
GFR: 85.61 mL/min (ref >60.00)
Glucose, Bld: 101 mg/dL — ABNORMAL HIGH (ref 70–99)
Potassium: 4.6 mEq/L (ref 3.5–5.1)
Sodium: 140 mEq/L (ref 135–145)

## 2020-11-28 LAB — HEPATITIS C ANTIBODY
Hepatitis C Ab: NONREACTIVE
SIGNAL TO CUT-OFF: 0.01 (ref <1.00)

## 2020-11-28 LAB — HEMOGLOBIN A1C: Hgb A1c MFr Bld: 5.4 % (ref 4.6–6.5)

## 2020-11-28 LAB — HEPATIC FUNCTION PANEL
ALT: 53 U/L — ABNORMAL HIGH (ref 0–35)
AST: 43 U/L — ABNORMAL HIGH (ref 0–37)
Albumin: 4.4 g/dL (ref 3.5–5.2)
Alkaline Phosphatase: 56 U/L (ref 39–117)
Bilirubin, Direct: 0.1 mg/dL (ref 0.0–0.3)
Total Bilirubin: 0.4 mg/dL (ref 0.2–1.2)
Total Protein: 6.7 g/dL (ref 6.0–8.3)

## 2020-11-28 LAB — LIPID PANEL
Cholesterol: 227 mg/dL — ABNORMAL HIGH (ref 0–200)
HDL: 32.7 mg/dL — ABNORMAL LOW (ref >39.00)
NonHDL: 194.42
Total CHOL/HDL Ratio: 7
Triglycerides: 252 mg/dL — ABNORMAL HIGH (ref 0.0–149.0)
VLDL: 50.4 mg/dL — ABNORMAL HIGH (ref 0.0–40.0)

## 2020-11-28 LAB — LDL CHOLESTEROL, DIRECT: Direct LDL: 162 mg/dL

## 2020-11-28 LAB — T4, FREE: Free T4: 0.7 ng/dL (ref 0.60–1.60)

## 2020-11-28 LAB — TSH: TSH: 2.65 u[IU]/mL (ref 0.35–4.50)

## 2020-11-28 LAB — HCG, QUANTITATIVE, PREGNANCY: Quantitative HCG: <0.6 m[IU]/mL

## 2020-11-28 LAB — T3, FREE: T3, Free: 2.9 pg/mL (ref 2.3–4.2)

## 2020-12-04 ENCOUNTER — Telehealth: Payer: Self-pay

## 2020-12-04 NOTE — Telephone Encounter (Signed)
Pt made appt thru my chart for 12/05/20 at 2:40 for rectal bleeding; pt has hemorrhoids but pt is concerned about amt of blood seen. I was unable to speak with pt and left v/m requesting pt to call [facility] back.sending note to myself and Lupita Leash CMA. Sending my chart message as well to pt requesting cb to [facility].

## 2020-12-04 NOTE — Telephone Encounter (Signed)
I spoke with pt and she said she has not had any bleeding today and pt has been constipated recently. Pt said she was going to cb to cancel appt on 12/05/20 due to no more bleeding at this time. Pt request me to cancel the 12/05/20 visit. Done. If pt thinks she does need to be seen or if bleeding restarts pt will cb for appt or go to UC or ED. UC & ED precautions given and pt voiced understanding. FYI to Dr Patsy Lager.

## 2020-12-05 ENCOUNTER — Ambulatory Visit: Payer: Medicaid Other | Admitting: Family Medicine

## 2021-01-08 ENCOUNTER — Ambulatory Visit: Payer: Medicaid Other | Admitting: Family Medicine

## 2021-01-24 ENCOUNTER — Ambulatory Visit: Payer: Medicaid Other | Admitting: Internal Medicine

## 2021-01-30 ENCOUNTER — Other Ambulatory Visit: Payer: Self-pay

## 2021-01-30 ENCOUNTER — Encounter: Payer: Self-pay | Admitting: Family Medicine

## 2021-01-30 ENCOUNTER — Ambulatory Visit: Payer: Self-pay | Admitting: Family Medicine

## 2021-01-30 VITALS — BP 110/74 | HR 65 | Temp 97.5°F | Ht 63.0 in | Wt 213.5 lb

## 2021-01-30 DIAGNOSIS — R109 Unspecified abdominal pain: Secondary | ICD-10-CM

## 2021-01-30 DIAGNOSIS — L299 Pruritus, unspecified: Secondary | ICD-10-CM

## 2021-01-30 LAB — POC URINALSYSI DIPSTICK (AUTOMATED)
Bilirubin, UA: NEGATIVE
Blood, UA: NEGATIVE
Glucose, UA: NEGATIVE
Ketones, UA: NEGATIVE
Nitrite, UA: NEGATIVE
Protein, UA: NEGATIVE
Spec Grav, UA: 1.01 (ref 1.010–1.025)
Urobilinogen, UA: 0.2 E.U./dL
pH, UA: 7 (ref 5.0–8.0)

## 2021-01-30 MED ORDER — SULFAMETHOXAZOLE-TRIMETHOPRIM 800-160 MG PO TABS: 1.0000 | ORAL_TABLET | Freq: Two times a day (BID) | ORAL | 0 refills | Status: AC

## 2021-01-30 NOTE — Progress Notes (Signed)
Spencer T. Copland, MD, CAQ Sports Medicine  Primary Care and Sports Medicine [facility] at [facility] [address] [place], [address]  Phone: [phone]  FAX: [phone]  Caroline Fletcher - 35 y.o. female  MRN [id number]  Date of Birth: 04/27/1986  Date: 01/30/2021  PCP: Hannah Beat, MD  Referral: Hannah Beat, MD  Chief Complaint  Patient presents with  . Flank Pain    Left-UTI vs Pulled Muscle  . Ears Itching    Wants ears checked    This visit occurred during the SARS-CoV-2 public health emergency.  Safety protocols were in place, including screening questions prior to the visit, additional usage of staff PPE, and extensive cleaning of exam room while observing appropriate contact time as indicated for disinfecting solutions.   Subjective:   Caroline Fletcher is a 35 y.o. very pleasant female patient with Body mass index is 37.82 kg/m. who presents with the following:  L flank pain vs MSK?  Ear itching: She has had some ear flaking and dry skin and some flaking on some of her shoulders.  They are itchy as well.  She has no known chronic skin conditions such as eczema or psoriasis.  She has been moving and doing the majority of this moving herself since her husband has been having quite a bit of difficulty after COVID-19, with oxygen requirements and he also has significant pulmonary embolism.  She was worried that this could potentially be a UTI.   Review of Systems is noted in the HPI, as appropriate  Objective:   BP 110/74   Pulse 65   Temp (!) 97.5 F (36.4 C) (Temporal)   Ht 5\' 3"  (1.6 m)   Wt 213 lb 8 oz (96.8 kg)   SpO2 98%   BMI 37.82 kg/m   GEN: No acute distress; alert,appropriate. PULM: Breathing comfortably in no respiratory distress PSYCH: Normally interactive.  ENT: TMs are clear bilaterally.  Patient does have some flaky skin and some mild irritation in the tympanic canal bilaterally. She is very taut from  L1-S1 bilaterally in the erector spinae complex.  The mid spine and its surrounding musculature including the lower traps and rhomboids are entirely nontender to palpation.  The spinous processes are entirely nontender.  CVAT is negative.  She is otherwise neurovascularly intact.  Laboratory and Imaging Data:  Assessment and Plan:     ICD-10-CM   1. Left flank pain  R10.9 POCT Urinalysis Dipstick (Automated)    Urine Culture  2. Ear itching  L29.9    For ears, start with Vaseline.  If this does not really help then over-the-counter hydrocortisone would be an appropriate next step.  Use on outside, use Q-tip to put small amount in the ear canal.  I think that her flank pain is really musculoskeletal with some minor muscle pull.  She does have leukocyte esterase in her urine, so I will send this for culture.  If this is negative, no need to pick up antibiotics.  Meds ordered this encounter  Medications  . sulfamethoxazole-trimethoprim (BACTRIM DS) 800-160 MG tablet    Sig: Take 1 tablet by mouth 2 (two) times daily for 7 days.    Dispense:  14 tablet    Refill:  0   Medications Discontinued During This Encounter  Medication Reason  . escitalopram (LEXAPRO) 10 MG tablet Change in therapy  . busPIRone (BUSPAR) 10 MG tablet Prescription never filled   Orders Placed This Encounter  Procedures  .  Urine Culture  . POCT Urinalysis Dipstick (Automated)    Follow-up: No follow-ups on file.  Signed,  Elpidio Galea. Copland, MD   Outpatient Encounter Medications as of 01/30/2021  Medication Sig  . albuterol (VENTOLIN HFA) 108 (90 Base) MCG/ACT inhaler Inhale 2 puffs into the lungs every 4 (four) hours as needed for wheezing or shortness of breath.  . cloNIDine (CATAPRES) 0.1 MG tablet Take 0.1 mg by mouth daily.   Marland Kitchen escitalopram (LEXAPRO) 20 MG tablet Take 1 tablet by mouth daily.  Marland Kitchen sulfamethoxazole-trimethoprim (BACTRIM DS) 800-160 MG tablet Take 1 tablet by mouth 2 (two) times daily  for 7 days.  . [DISCONTINUED] busPIRone (BUSPAR) 10 MG tablet Take by mouth.  . [DISCONTINUED] escitalopram (LEXAPRO) 10 MG tablet Take 1/2 tablet daily x 1 week, then increase to 1 tablet daily.   No facility-administered encounter medications on file as of 01/30/2021.

## 2021-01-31 LAB — URINE CULTURE
MICRO NUMBER:: 11632283
SPECIMEN QUALITY:: ADEQUATE

## 2021-04-04 ENCOUNTER — Ambulatory Visit
Admission: EM | Admit: 2021-04-04 | Discharge: 2021-04-04 | Disposition: A | Discharge status: Home / Self Care | Payer: Medicaid Other | Attending: Emergency Medicine | Admitting: Emergency Medicine

## 2021-04-04 DIAGNOSIS — J069 Acute upper respiratory infection, unspecified: Secondary | ICD-10-CM

## 2021-04-04 DIAGNOSIS — H6992 Unspecified Eustachian tube disorder, left ear: Secondary | ICD-10-CM

## 2021-04-04 DIAGNOSIS — H6982 Other specified disorders of Eustachian tube, left ear: Secondary | ICD-10-CM

## 2021-04-04 DIAGNOSIS — H9202 Otalgia, left ear: Secondary | ICD-10-CM

## 2021-04-04 DIAGNOSIS — Z1152 Encounter for screening for COVID-19: Secondary | ICD-10-CM

## 2021-04-04 NOTE — ED Triage Notes (Signed)
Pt reports having L ear pain x3 days. No other concerns at this time.

## 2021-04-04 NOTE — Discharge Instructions (Signed)
Take ibuprofen as needed for discomfort and Mucinex as needed for congestion.    COVID test is pending.  You should self quarantine until the test result is back.    Follow up with your primary care provider if your symptoms are not improving.

## 2021-04-04 NOTE — ED Provider Notes (Signed)
[name]    CSN: [id number] Arrival date & time: 04/04/21  1056      History   Chief Complaint Chief Complaint  Patient presents with  . Otalgia    HPI Caroline Fletcher is a 35 y.o. female.   Patient presents with 3-day history of left ear pain.  No drainage or change in hearing.  She also reports nasal congestion and runny nose.  She denies fever, chills, cough, shortness of breath, vomiting, diarrhea, or other symptoms.  Treatment at home with ibuprofen.  Her medical history includes asthma, hypertension, Tourette's.  The history is provided by the patient and medical records.    Past Medical History:  Diagnosis Date  . Asthma, chronic 06/15/2013  . Cleft palate   . History of abnormal cervical Pap smear 2014   LGSIL with positive HRHPV  . 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 Hypertension   . Tourette's disorder    Dr. Zola Button    Patient Active Problem List   Diagnosis Date Noted  . Hypertension 03/16/2018  . Obesity (BMI 35.0-39.9 without comorbidity) 01/06/2018  . Oligomenorrhea 09/08/2017  . Asthma, chronic 06/15/2013  . TOURETTE'S DISORDER ([facility]) 03/07/2008    Past Surgical History:  Procedure Laterality Date  . CHROMOPERTUBATION  05/03/2019   Procedure: CHROMOPERTUBATION;  Surgeon: Ward, Elenora Fender, MD;  Location: [facility];  Service: Gynecology;;  . Creig Hines  2009  . HYSTEROSCOPY WITH D & C N/A 05/03/2019   Procedure: DILATATION AND CURETTAGE /HYSTEROSCOPY;  Surgeon: Ward, Elenora Fender, MD;  Location: [facility];  Service: Gynecology;  Laterality: N/A;  . INTRAUTERINE DEVICE (IUD) INSERTION N/A 05/03/2019   Procedure: INTRAUTERINE DEVICE (IUD) INSERTION;  Surgeon: Ward, Elenora Fender, MD;  Location: [facility];  Service: Gynecology;  Laterality: N/A;  . LAPAROSCOPY N/A 05/03/2019   Procedure: LAPAROSCOPY DIAGNOSTIC;  Surgeon: Ward, Elenora Fender, MD;  Location: [facility];  Service: Gynecology;  Laterality: N/A;  .  MYRINGOTOMY     bilateral  . Pallet repair  1990    OB History    Gravida  4   Para  2   Term  2   Preterm      AB  1   Living  2     SAB  1   IAB      Ectopic      Multiple  0   Live Births  2            Home Medications    Prior to Admission medications   Medication Sig Start Date End Date Taking? Authorizing Provider  albuterol (VENTOLIN HFA) 108 (90 Base) MCG/ACT inhaler Inhale 2 puffs into the lungs every 4 (four) hours as needed for wheezing or shortness of breath. 07/03/20   Mickie Bail, NP  cloNIDine (CATAPRES) 0.1 MG tablet Take 0.1 mg by mouth daily.     [provider]  escitalopram (LEXAPRO) 20 MG tablet Take 1 tablet by mouth daily. 01/22/21   [provider]    Family History Family History  Problem Relation Age of Onset  . Healthy Father   . Healthy Mother   . Stroke Paternal Grandmother   . Heart attack Paternal Grandmother   . Diabetes Maternal Grandmother   . Breast cancer Maternal Grandmother 69  . Cancer Neg Hx     Social History Social History   Tobacco Use  . Smoking status: Never Smoker  . Smokeless tobacco: Never  Used  Vaping Use  . Vaping Use: Never used  Substance Use Topics  . Alcohol use: No    Comment: Occasional  . Drug use: No     Allergies   Patient has no known allergies.   Review of Systems Review of Systems  Constitutional: Negative for chills and fever.  HENT: Positive for congestion, ear pain and rhinorrhea. Negative for ear discharge, hearing loss and sore throat.   Respiratory: Negative for cough and shortness of breath.   Cardiovascular: Negative for chest pain and palpitations.  Gastrointestinal: Negative for abdominal pain, diarrhea and vomiting.  Skin: Negative for color change and rash.  All other systems reviewed and are negative.    Physical Exam Triage Vital Signs ED Triage Vitals  Enc Vitals Group     BP      Pulse      Resp      Temp      Temp src      SpO2       Weight      Height      Head Circumference      Peak Flow      Pain Score      Pain Loc      Pain Edu?      Excl. in GC?    No data found.  Updated Vital Signs BP (!) 146/83   Pulse 88   Temp 98.4 F (36.9 C) (Oral)   Resp 16   Ht 5\' 1"  (1.549 m)   Wt 213 lb (96.6 kg)   LMP 01/12/2021   SpO2 96%   BMI 40.25 kg/m   Visual Acuity Right Eye Distance:   Left Eye Distance:   Bilateral Distance:    Right Eye Near:   Left Eye Near:    Bilateral Near:     Physical Exam Vitals and nursing note reviewed.  Constitutional:      General: She is not in acute distress.    Appearance: She is well-developed. She is not ill-appearing.  HENT:     Head: Normocephalic and atraumatic.     Right Ear: Tympanic membrane and ear canal normal.     Left Ear: Tympanic membrane and ear canal normal.     Nose: Rhinorrhea present.     Mouth/Throat:     Mouth: Mucous membranes are moist.     Pharynx: Oropharynx is clear.  Eyes:     Conjunctiva/sclera: Conjunctivae normal.  Cardiovascular:     Rate and Rhythm: Normal rate and regular rhythm.     Heart sounds: Normal heart sounds.  Pulmonary:     Effort: Pulmonary effort is normal. No respiratory distress.     Breath sounds: Normal breath sounds.  Abdominal:     Palpations: Abdomen is soft.     Tenderness: There is no abdominal tenderness.  Musculoskeletal:     Cervical back: Neck supple.  Skin:    General: Skin is warm and dry.  Neurological:     General: No focal deficit present.     Mental Status: She is alert and oriented to person, place, and time.  Psychiatric:        Mood and Affect: Mood normal.        Behavior: Behavior normal.      UC Treatments / Results  Labs (all labs ordered are listed, but only abnormal results are displayed) Labs Reviewed  NOVEL CORONAVIRUS, NAA    EKG   Radiology No results found.  Procedures Procedures (  including critical care time)  Medications Ordered in UC Medications -  No data to display  Initial Impression / Assessment and Plan / UC Course  I have reviewed the triage vital signs and the nursing notes.  Pertinent labs & imaging results that were available during my care of the patient were reviewed by me and considered in my medical decision making (see chart for details).   Left otalgia and eustachian tube dysfunction, URI.  PCR COVID pending.  Instructed patient to self quarantine until the test result is back.  Discussed symptomatic treatment including ibuprofen and Mucinex.  Instructed patient to follow-up with her PCP if her symptoms are not improving.  Patient agrees to plan of care.   Final Clinical Impressions(s) / UC Diagnoses   Final diagnoses:  Encounter for screening for COVID-19  Acute otalgia, left  Acute dysfunction of left eustachian tube  Upper respiratory tract infection, unspecified type     Discharge Instructions     Take ibuprofen as needed for discomfort and Mucinex as needed for congestion.    COVID test is pending.  You should self quarantine until the test result is back.    Follow up with your primary care provider if your symptoms are not improving.        ED Prescriptions    None     PDMP not reviewed this encounter.   Mickie Bail, NP 04/04/21 1125

## 2021-04-05 LAB — NOVEL CORONAVIRUS, NAA: SARS-CoV-2, NAA: NOT DETECTED

## 2021-04-05 LAB — SARS-COV-2, NAA 2 DAY TAT

## 2021-04-14 NOTE — Progress Notes (Signed)
Letter mailed from [facility] to notify of normal mammogram results.  Patient to return in one year for annual screening.  Copy to [facility]. 

## 2021-05-19 ENCOUNTER — Telehealth: Payer: Self-pay | Admitting: Family Medicine

## 2021-05-19 NOTE — Telephone Encounter (Signed)
letter

## 2021-07-05 ENCOUNTER — Ambulatory Visit
Admission: EM | Admit: 2021-07-05 | Discharge: 2021-07-05 | Disposition: A | Discharge status: Home / Self Care | Payer: Self-pay | Attending: Emergency Medicine | Admitting: Emergency Medicine

## 2021-07-05 ENCOUNTER — Other Ambulatory Visit: Payer: Self-pay

## 2021-07-05 ENCOUNTER — Encounter: Payer: Self-pay | Admitting: Emergency Medicine

## 2021-07-05 DIAGNOSIS — H60502 Unspecified acute noninfective otitis externa, left ear: Secondary | ICD-10-CM

## 2021-07-05 MED ORDER — NEOMYCIN-POLYMYXIN-HC 3.5-10000-1 OT SUSP: 4.0000 [drp] | Freq: Three times a day (TID) | OTIC | 0 refills | Status: DC

## 2021-07-05 NOTE — ED Provider Notes (Signed)
Caroline Fletcher    CSN: [id number] Arrival date & time: 07/05/21  6144      History   Chief Complaint Chief Complaint  Patient presents with   Otalgia    HPI Caroline Fletcher is a 35 y.o. female.  Patient presents with left ear pain and drainage x4 days.  She denies fever, chills, change in hearing, sore throat, cough, shortness of breath, wheezing, or other symptoms.  Treatment at home with ibuprofen.  Her medical history includes hypertension, asthma, Tourette's disorder.  The history is provided by the patient and medical records.   Past Medical History:  Diagnosis Date   Asthma, chronic 06/15/2013   Cleft palate    History of abnormal cervical Pap smear 2014   LGSIL with positive HRHPV   Human papilloma virus (HPV) type 9 vaccine administered    gardisil completed   Hypertension    Tourette's disorder    Dr. Zola Button    Patient Active Problem List   Diagnosis Date Noted   Hypertension 03/16/2018   Obesity (BMI 35.0-39.9 without comorbidity) 01/06/2018   Oligomenorrhea 09/08/2017   Asthma, chronic 06/15/2013   TOURETTE'S DISORDER ([facility]) 03/07/2008    Past Surgical History:  Procedure Laterality Date   CHROMOPERTUBATION  05/03/2019   Procedure: CHROMOPERTUBATION;  Surgeon: Ward, Elenora Fender, MD;  Location: [facility];  Service: Gynecology;;   COLPOSCOPY  2009   HYSTEROSCOPY WITH D & C N/A 05/03/2019   Procedure: DILATATION AND CURETTAGE Melton Krebs;  Surgeon: Ward, Elenora Fender, MD;  Location: [facility];  Service: Gynecology;  Laterality: N/A;   INTRAUTERINE DEVICE (IUD) INSERTION N/A 05/03/2019   Procedure: INTRAUTERINE DEVICE (IUD) INSERTION;  Surgeon: Ward, Elenora Fender, MD;  Location: [facility];  Service: Gynecology;  Laterality: N/A;   LAPAROSCOPY N/A 05/03/2019   Procedure: LAPAROSCOPY DIAGNOSTIC;  Surgeon: Ward, Elenora Fender, MD;  Location: [facility];  Service: Gynecology;  Laterality: N/A;   MYRINGOTOMY     bilateral   Pallet repair  1990    OB History      Gravida  4   Para  2   Term  2   Preterm      AB  1   Living  2      SAB  1   IAB      Ectopic      Multiple  0   Live Births  2            Home Medications    Prior to Admission medications   Medication Sig Start Date End Date Taking? Authorizing Provider  neomycin-polymyxin-hydrocortisone (CORTISPORIN) 3.5-10000-1 OTIC suspension Place 4 drops into the left ear 3 (three) times daily. 07/05/21  Yes Mickie Bail, NP  albuterol (VENTOLIN HFA) 108 (90 Base) MCG/ACT inhaler Inhale 2 puffs into the lungs every 4 (four) hours as needed for wheezing or shortness of breath. 07/03/20   Mickie Bail, NP  cloNIDine (CATAPRES) 0.1 MG tablet Take 0.1 mg by mouth daily.     [provider]  escitalopram (LEXAPRO) 20 MG tablet Take 1 tablet by mouth daily. 01/22/21   [provider]    Family History Family History  Problem Relation Age of Onset   Healthy Father    Healthy Mother    Stroke Paternal Grandmother    Heart attack Paternal Grandmother    Diabetes Maternal Grandmother    Breast cancer Maternal Grandmother 5   Cancer Neg Hx     Social History Social History  Tobacco Use   Smoking status: Never   Smokeless tobacco: Never  Vaping Use   Vaping Use: Never used  Substance Use Topics   Alcohol use: No    Comment: Occasional   Drug use: No     Allergies   Patient has no known allergies.   Review of Systems Review of Systems  Constitutional:  Negative for chills and fever.  HENT:  Positive for ear discharge and ear pain. Negative for sore throat.   Respiratory:  Negative for cough and shortness of breath.   Cardiovascular:  Negative for chest pain and palpitations.  Gastrointestinal:  Negative for abdominal pain, diarrhea and vomiting.  Skin:  Negative for color change and rash.  All other systems reviewed and are negative.   Physical Exam Triage Vital Signs ED Triage Vitals  Enc Vitals Group     BP      Pulse      Resp       Temp      Temp src      SpO2      Weight      Height      Head Circumference      Peak Flow      Pain Score      Pain Loc      Pain Edu?      Excl. in GC?    No data found.  Updated Vital Signs BP 122/87   Pulse 84   Temp 98.1 F (36.7 C) (Oral)   Resp 20   SpO2 100%   Visual Acuity Right Eye Distance:   Left Eye Distance:   Bilateral Distance:    Right Eye Near:   Left Eye Near:    Bilateral Near:     Physical Exam Vitals and nursing note reviewed.  Constitutional:      General: She is not in acute distress.    Appearance: She is well-developed. She is not ill-appearing.  HENT:     Head: Normocephalic and atraumatic.     Right Ear: Tympanic membrane and ear canal normal.     Left Ear: Tympanic membrane normal.     Ears:     Comments: Pain with movement of left external ear.  Canal mildly erythematous; no drainage.    Nose: Nose normal.     Mouth/Throat:     Mouth: Mucous membranes are moist.     Pharynx: Oropharynx is clear.  Eyes:     Conjunctiva/sclera: Conjunctivae normal.  Cardiovascular:     Rate and Rhythm: Normal rate and regular rhythm.     Heart sounds: No murmur heard. Pulmonary:     Effort: Pulmonary effort is normal. No respiratory distress.     Breath sounds: Normal breath sounds.  Abdominal:     Palpations: Abdomen is soft.     Tenderness: There is no abdominal tenderness.  Musculoskeletal:     Cervical back: Neck supple.  Skin:    General: Skin is warm and dry.  Neurological:     General: No focal deficit present.     Mental Status: She is alert and oriented to person, place, and time.     Gait: Gait normal.  Psychiatric:        Mood and Affect: Mood normal.        Behavior: Behavior normal.     UC Treatments / Results  Labs (all labs ordered are listed, but only abnormal results are displayed) Labs Reviewed - No data to display  EKG   Radiology No results found.  Procedures Procedures (including critical care  time)  Medications Ordered in UC Medications - No data to display  Initial Impression / Assessment and Plan / UC Course  I have reviewed the triage vital signs and the nursing notes.  Pertinent labs & imaging results that were available during my care of the patient were reviewed by me and considered in my medical decision making (see chart for details).  Left otitis externa.  Treating with Cortisporin eardrops.  Instructed patient to continue ibuprofen as needed for discomfort and to follow-up with her PCP if her symptoms are not improving.  Education provided on otitis externa.  Patient agrees to plan of care.   Final Clinical Impressions(s) / UC Diagnoses   Final diagnoses:  Acute otitis externa of left ear, unspecified type     Discharge Instructions      Use the eardrops as directed.    Follow up with your primary care provider if your symptoms are not improving.         ED Prescriptions     Medication Sig Dispense Auth. Provider   neomycin-polymyxin-hydrocortisone (CORTISPORIN) 3.5-10000-1 OTIC suspension Place 4 drops into the left ear 3 (three) times daily. 10 mL Mickie Bail, NP      PDMP not reviewed this encounter.   Mickie Bail, NP 07/05/21 [phone]

## 2021-07-05 NOTE — Discharge Instructions (Addendum)
Use the ear drops as directed.  Follow up with your primary care provider if your symptoms are not improving.    

## 2021-07-05 NOTE — ED Triage Notes (Signed)
Pt here with left ear pain since Tuesday with some drainage, pressure, and more recently nasal congestion. States there is blood in nasal secretions.

## 2021-07-11 ENCOUNTER — Ambulatory Visit
Admission: EM | Admit: 2021-07-11 | Discharge: 2021-07-11 | Disposition: A | Discharge status: Home / Self Care | Payer: Medicaid Other | Attending: Emergency Medicine | Admitting: Emergency Medicine

## 2021-07-11 ENCOUNTER — Other Ambulatory Visit: Payer: Self-pay

## 2021-07-11 ENCOUNTER — Encounter: Payer: Self-pay | Admitting: Emergency Medicine

## 2021-07-11 DIAGNOSIS — B349 Viral infection, unspecified: Secondary | ICD-10-CM

## 2021-07-11 DIAGNOSIS — Z1152 Encounter for screening for COVID-19: Secondary | ICD-10-CM

## 2021-07-11 MED ORDER — BENZONATATE 100 MG PO CAPS: 100.0000 mg | ORAL_CAPSULE | Freq: Three times a day (TID) | ORAL | 0 refills | Status: DC | PRN

## 2021-07-11 NOTE — Discharge Instructions (Addendum)
Your COVID test is pending.  You should self quarantine until the test result is back.    Take Tylenol or ibuprofen as needed for fever or discomfort.  Rest and keep yourself hydrated.    Follow-up with your primary care provider if your symptoms are not improving.     

## 2021-07-11 NOTE — ED Provider Notes (Signed)
[name]    CSN: [id number] Arrival date & time: 07/11/21  1557      History   Chief Complaint Chief Complaint  Patient presents with   Nasal Congestion   Cough    HPI Caroline Fletcher is a 35 y.o. female.  Patient presents with 2-day history of sinus congestion, nasal drainage, postnasal drip, cough.  Her husband is COVID positive.  She denies fever, chills, wheezing, shortness of breath, or other symptoms.  No treatments attempted at home.  Patient was seen here on 07/05/2021; diagnosed with otitis externa.  Her medical history includes asthma and hypertension.  The history is provided by the patient and medical records.   Past Medical History:  Diagnosis Date   Asthma, chronic 06/15/2013   Cleft palate    History of abnormal cervical Pap smear 2014   LGSIL with positive HRHPV   Human papilloma virus (HPV) type 9 vaccine administered    gardisil completed   Hypertension    Tourette's disorder    Dr. Zola Button    Patient Active Problem List   Diagnosis Date Noted   Hypertension 03/16/2018   Obesity (BMI 35.0-39.9 without comorbidity) 01/06/2018   Oligomenorrhea 09/08/2017   Asthma, chronic 06/15/2013   TOURETTE'S DISORDER ([facility]) 03/07/2008    Past Surgical History:  Procedure Laterality Date   CHROMOPERTUBATION  05/03/2019   Procedure: CHROMOPERTUBATION;  Surgeon: Ward, Elenora Fender, MD;  Location: [facility];  Service: Gynecology;;   COLPOSCOPY  2009   HYSTEROSCOPY WITH D & C N/A 05/03/2019   Procedure: DILATATION AND CURETTAGE Melton Krebs;  Surgeon: Ward, Elenora Fender, MD;  Location: [facility];  Service: Gynecology;  Laterality: N/A;   INTRAUTERINE DEVICE (IUD) INSERTION N/A 05/03/2019   Procedure: INTRAUTERINE DEVICE (IUD) INSERTION;  Surgeon: Ward, Elenora Fender, MD;  Location: [facility];  Service: Gynecology;  Laterality: N/A;   LAPAROSCOPY N/A 05/03/2019   Procedure: LAPAROSCOPY DIAGNOSTIC;  Surgeon: Ward, Elenora Fender, MD;  Location: [facility];  Service: Gynecology;   Laterality: N/A;   MYRINGOTOMY     bilateral   Pallet repair  1990    OB History     Gravida  4   Para  2   Term  2   Preterm      AB  1   Living  2      SAB  1   IAB      Ectopic      Multiple  0   Live Births  2            Home Medications    Prior to Admission medications   Medication Sig Start Date End Date Taking? Authorizing Provider  benzonatate (TESSALON) 100 MG capsule Take 1 capsule (100 mg total) by mouth 3 (three) times daily as needed for cough. 07/11/21  Yes Mickie Bail, NP  albuterol (VENTOLIN HFA) 108 (90 Base) MCG/ACT inhaler Inhale 2 puffs into the lungs every 4 (four) hours as needed for wheezing or shortness of breath. 07/03/20   Mickie Bail, NP  cloNIDine (CATAPRES) 0.1 MG tablet Take 0.1 mg by mouth daily.     [provider]  escitalopram (LEXAPRO) 20 MG tablet Take 1 tablet by mouth daily. 01/22/21   [provider]  neomycin-polymyxin-hydrocortisone (CORTISPORIN) 3.5-10000-1 OTIC suspension Place 4 drops into the left ear 3 (three) times daily. 07/05/21   Mickie Bail, NP    Family History Family History  Problem Relation Age of Onset   Healthy Father  Healthy Mother    Stroke Paternal Grandmother    Heart attack Paternal Grandmother    Diabetes Maternal Grandmother    Breast cancer Maternal Grandmother 57   Cancer Neg Hx     Social History Social History   Tobacco Use   Smoking status: Never   Smokeless tobacco: Never  Vaping Use   Vaping Use: Never used  Substance Use Topics   Alcohol use: No    Comment: Occasional   Drug use: No     Allergies   Patient has no known allergies.   Review of Systems Review of Systems  Constitutional:  Negative for chills and fever.  HENT:  Positive for congestion, postnasal drip and rhinorrhea. Negative for ear pain and sore throat.   Respiratory:  Positive for cough. Negative for shortness of breath.   Cardiovascular:  Negative for chest pain and  palpitations.  Gastrointestinal:  Negative for abdominal pain and vomiting.  Skin:  Negative for color change and rash.  All other systems reviewed and are negative.   Physical Exam Triage Vital Signs ED Triage Vitals  Enc Vitals Group     BP      Pulse      Resp      Temp      Temp src      SpO2      Weight      Height      Head Circumference      Peak Flow      Pain Score      Pain Loc      Pain Edu?      Excl. in GC?    No data found.  Updated Vital Signs BP 125/81 (BP Location: Left Arm)   Pulse 88   Temp 98.3 F (36.8 C) (Oral)   Resp 18   SpO2 95%   Visual Acuity Right Eye Distance:   Left Eye Distance:   Bilateral Distance:    Right Eye Near:   Left Eye Near:    Bilateral Near:     Physical Exam Vitals and nursing note reviewed.  Constitutional:      General: She is not in acute distress.    Appearance: She is well-developed. She is not ill-appearing.  HENT:     Head: Normocephalic and atraumatic.     Right Ear: Tympanic membrane and ear canal normal.     Left Ear: Tympanic membrane and ear canal normal.     Nose: Rhinorrhea present.     Mouth/Throat:     Mouth: Mucous membranes are moist.     Pharynx: Oropharynx is clear.  Eyes:     Conjunctiva/sclera: Conjunctivae normal.  Cardiovascular:     Rate and Rhythm: Normal rate and regular rhythm.     Heart sounds: Normal heart sounds.  Pulmonary:     Effort: Pulmonary effort is normal. No respiratory distress.     Breath sounds: Normal breath sounds. No wheezing.  Abdominal:     Palpations: Abdomen is soft.     Tenderness: There is no abdominal tenderness.  Musculoskeletal:     Cervical back: Neck supple.  Skin:    General: Skin is warm and dry.  Neurological:     General: No focal deficit present.     Mental Status: She is alert and oriented to person, place, and time.     Gait: Gait normal.  Psychiatric:        Mood and Affect: Mood normal.  Behavior: Behavior normal.     UC  Treatments / Results  Labs (all labs ordered are listed, but only abnormal results are displayed) Labs Reviewed  NOVEL CORONAVIRUS, NAA    EKG   Radiology No results found.  Procedures Procedures (including critical care time)  Medications Ordered in UC Medications - No data to display  Initial Impression / Assessment and Plan / UC Course  I have reviewed the triage vital signs and the nursing notes.  Pertinent labs & imaging results that were available during my care of the patient were reviewed by me and considered in my medical decision making (see chart for details).   Viral illness.  COVID pending.  Instructed patient to self quarantine per CDC guidelines.  Discussed symptomatic treatment including Tylenol or ibuprofen, rest, hydration.  Instructed patient to follow up with PCP if symptoms are not improving.  Patient agrees to plan of care.    Final Clinical Impressions(s) / UC Diagnoses   Final diagnoses:  Encounter for screening for COVID-19  Viral illness     Discharge Instructions      Your COVID test is pending.  You should self quarantine until the test result is back.    Take Tylenol or ibuprofen as needed for fever or discomfort.  Rest and keep yourself hydrated.    Follow-up with your primary care provider if your symptoms are not improving.         ED Prescriptions     Medication Sig Dispense Auth. Provider   benzonatate (TESSALON) 100 MG capsule Take 1 capsule (100 mg total) by mouth 3 (three) times daily as needed for cough. 21 capsule Mickie Bail, NP      PDMP not reviewed this encounter.   Mickie Bail, NP 07/11/21 [phone]

## 2021-07-11 NOTE — ED Triage Notes (Signed)
Pt here with cough, congestion and drainage x 2 days. Husband is positive for Covid.

## 2021-07-13 LAB — NOVEL CORONAVIRUS, NAA: SARS-CoV-2, NAA: NOT DETECTED

## 2021-07-13 LAB — SARS-COV-2, NAA 2 DAY TAT

## 2021-08-17 ENCOUNTER — Ambulatory Visit
Admission: EM | Admit: 2021-08-17 | Discharge: 2021-08-17 | Disposition: A | Discharge status: Home / Self Care | Payer: Self-pay

## 2021-08-17 ENCOUNTER — Encounter: Payer: Self-pay | Admitting: Emergency Medicine

## 2021-08-17 ENCOUNTER — Other Ambulatory Visit: Payer: Self-pay

## 2021-08-17 DIAGNOSIS — J209 Acute bronchitis, unspecified: Secondary | ICD-10-CM

## 2021-08-17 DIAGNOSIS — J0100 Acute maxillary sinusitis, unspecified: Secondary | ICD-10-CM

## 2021-08-17 MED ORDER — DOXYCYCLINE HYCLATE 100 MG PO CAPS: 100.0000 mg | ORAL_CAPSULE | Freq: Two times a day (BID) | ORAL | 0 refills | Status: AC

## 2021-08-17 MED ORDER — ALBUTEROL SULFATE HFA 108 (90 BASE) MCG/ACT IN AERS: 1.0000 | INHALATION_SPRAY | Freq: Four times a day (QID) | RESPIRATORY_TRACT | 0 refills | Status: DC | PRN

## 2021-08-17 MED ORDER — PREDNISONE 20 MG PO TABS: 40.0000 mg | ORAL_TABLET | Freq: Every day | ORAL | 0 refills | Status: AC

## 2021-08-17 NOTE — ED Provider Notes (Signed)
Caroline Fletcher    CSN: [id number] Arrival date & time: 08/17/21  5956      History   Chief Complaint Chief Complaint  Patient presents with   URI    HPI Caroline Fletcher is a 35 y.o. female.  Presents to the injury care facility for evaluation of 2 weeks of sinus pain, pressure, dry cough.  No fevers, she no.  She has been unable impurity symptoms over the last couple weeks.  She has had negative COVID test.  She denies any chest pain, shortness of breath but has had some slight wheezing.  She has been taking some over-the-counter medications with little relief.  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 Zola Button    Patient Active Problem List   Diagnosis Date Noted   Hypertension 03/16/2018   Obesity (BMI 35.0-39.9 without comorbidity) 01/06/2018   Oligomenorrhea 09/08/2017   Asthma, chronic 06/15/2013   TOURETTE'S DISORDER ([facility]) 03/07/2008    Past Surgical History:  Procedure Laterality Date   CHROMOPERTUBATION  05/03/2019   Procedure: CHROMOPERTUBATION;  Surgeon: Ward, Elenora Fender, MD;  Location: [facility];  Service: Gynecology;;   COLPOSCOPY  2009   HYSTEROSCOPY WITH D & C N/A 05/03/2019   Procedure: DILATATION AND CURETTAGE Melton Krebs;  Surgeon: Ward, Elenora Fender, MD;  Location: [facility];  Service: Gynecology;  Laterality: N/A;   INTRAUTERINE DEVICE (IUD) INSERTION N/A 05/03/2019   Procedure: INTRAUTERINE DEVICE (IUD) INSERTION;  Surgeon: Ward, Elenora Fender, MD;  Location: [facility];  Service: Gynecology;  Laterality: N/A;   LAPAROSCOPY N/A 05/03/2019   Procedure: LAPAROSCOPY DIAGNOSTIC;  Surgeon: Ward, Elenora Fender, MD;  Location: [facility];  Service: Gynecology;  Laterality: N/A;   MYRINGOTOMY     bilateral   Pallet repair   1990    OB History     Gravida  4   Para  2   Term  2   Preterm      AB  1   Living  2      SAB  1   IAB      Ectopic      Multiple  0   Live Births  2            Home Medications    Prior to Admission medications   Medication Sig Start Date End Date Taking? Authorizing Provider  albuterol (VENTOLIN HFA) 108 (90 Base) MCG/ACT inhaler Inhale 1-2 puffs into the lungs every 6 (six) hours as needed for wheezing or shortness of breath. 08/17/21  Yes Evon Slack, PA-C  cloNIDine (CATAPRES) 0.1 MG tablet Take 0.1 mg by mouth daily.    Yes [provider]  doxycycline (VIBRAMYCIN) 100 MG capsule Take 1 capsule (100 mg total) by mouth 2 (two) times daily for 10 days. 08/17/21 08/27/21 Yes Evon Slack, PA-C  escitalopram (LEXAPRO) 20 MG tablet Take 1 tablet by mouth daily. 01/22/21  Yes [provider]  predniSONE (DELTASONE) 20 MG tablet Take 2 tablets (40 mg total) by mouth daily for 5 days. 08/17/21 08/22/21 Yes Evon Slack, PA-C  topiramate (TOPAMAX) 25 MG tablet Take by mouth. 06/25/21  Yes [provider]  benzonatate (TESSALON) 100 MG  capsule Take 1 capsule (100 mg total) by mouth 3 (three) times daily as needed for cough. 07/11/21   Mickie Bail, NP  neomycin-polymyxin-hydrocortisone (CORTISPORIN) 3.5-10000-1 OTIC suspension Place 4 drops into the left ear 3 (three) times daily. 07/05/21   Mickie Bail, NP    Family History Family History  Problem Relation Age of Onset   Healthy Father    Healthy Mother    Stroke Paternal Grandmother    Heart attack Paternal Grandmother    Diabetes Maternal Grandmother    Breast cancer Maternal Grandmother 76   Cancer Neg Hx     Social History Social History   Tobacco Use   Smoking status: Never   Smokeless tobacco: Never  Vaping Use   Vaping Use: Never used  Substance Use Topics   Alcohol use: No    Comment: Occasional   Drug use: No     Allergies   Patient has no known  allergies.   Review of Systems Review of Systems  Constitutional:  Positive for chills. Negative for fatigue and fever.  HENT:  Positive for congestion, facial swelling, rhinorrhea, sinus pressure, sinus pain and sore throat. Negative for ear discharge, trouble swallowing and voice change.   Respiratory:  Positive for cough and wheezing. Negative for shortness of breath and stridor.   Cardiovascular:  Negative for chest pain.  Gastrointestinal:  Negative for abdominal pain, diarrhea, nausea and vomiting.  Genitourinary:  Negative for dysuria, flank pain and pelvic pain.  Musculoskeletal:  Positive for myalgias. Negative for back pain.  Skin:  Negative for rash.  Neurological:  Negative for dizziness and headaches.    Physical Exam Triage Vital Signs ED Triage Vitals [08/17/21 0956]  Enc Vitals Group     BP 121/79     Pulse Rate 95     Resp      Temp 98.8 F (37.1 C)     Temp Source Oral     SpO2 96 %     Weight      Height      Head Circumference      Peak Flow      Pain Score      Pain Loc      Pain Edu?      Excl. in GC?    No data found.  Updated Vital Signs BP 121/79 (BP Location: Left Arm)   Pulse 95   Temp 98.8 F (37.1 C) (Oral)   SpO2 96%   Visual Acuity Right Eye Distance:   Left Eye Distance:   Bilateral Distance:    Right Eye Near:   Left Eye Near:    Bilateral Near:     Physical Exam Constitutional:      General: She is not in acute distress.    Appearance: She is well-developed.  HENT:     Head: Normocephalic and atraumatic.     Jaw: No trismus.     Comments: Positive frontal maxillary sinus tenderness.    Right Ear: Hearing, tympanic membrane, ear canal and external ear normal.     Left Ear: Hearing, tympanic membrane, ear canal and external ear normal.     Nose: Rhinorrhea present.     Mouth/Throat:     Pharynx: Posterior oropharyngeal erythema present. No oropharyngeal exudate or uvula swelling.     Tonsils: No tonsillar exudate or  tonsillar abscesses.  Eyes:     General:        Right eye: No discharge.  Left eye: No discharge.     Conjunctiva/sclera: Conjunctivae normal.  Cardiovascular:     Rate and Rhythm: Normal rate and regular rhythm.  Pulmonary:     Effort: Pulmonary effort is normal. No respiratory distress.     Breath sounds: No stridor. Wheezing present. No rales.     Comments: Subtle expiratory wheezing Abdominal:     General: There is no distension.     Palpations: Abdomen is soft.     Tenderness: There is no abdominal tenderness.  Musculoskeletal:        General: No deformity. Normal range of motion.     Cervical back: Normal range of motion.  Lymphadenopathy:     Cervical: Cervical adenopathy present.  Skin:    General: Skin is warm and dry.  Neurological:     Mental Status: She is alert and oriented to person, place, and time.     Deep Tendon Reflexes: Reflexes are normal and symmetric.  Psychiatric:        Behavior: Behavior normal.        Thought Content: Thought content normal.     UC Treatments / Results  Labs (all labs ordered are listed, but only abnormal results are displayed) Labs Reviewed - No data to display  EKG   Radiology No results found.  Procedures Procedures (including critical care time)  Medications Ordered in UC Medications - No data to display  Initial Impression / Assessment and Plan / UC Course  I have reviewed the triage vital signs and the nursing notes.  Pertinent labs & imaging results that were available during my care of the patient were reviewed by me and considered in my medical decision making (see chart for details).     35 year old female with bronchitis and acute maxillary sinusitis.  She is placed on doxycycline given prednisone albuterol.  Vital signs are stable.  She understands signs symptoms return to the urgent care for. Final Clinical Impressions(s) / UC Diagnoses   Final diagnoses:  Acute non-recurrent maxillary sinusitis   Acute bronchitis, unspecified organism   Discharge Instructions   None    ED Prescriptions     Medication Sig Dispense Auth. Provider   doxycycline (VIBRAMYCIN) 100 MG capsule Take 1 capsule (100 mg total) by mouth 2 (two) times daily for 10 days. 20 capsule Evon Slack, PA-C   albuterol (VENTOLIN HFA) 108 (90 Base) MCG/ACT inhaler Inhale 1-2 puffs into the lungs every 6 (six) hours as needed for wheezing or shortness of breath. 1 each Evon Slack, PA-C   predniSONE (DELTASONE) 20 MG tablet Take 2 tablets (40 mg total) by mouth daily for 5 days. 10 tablet Evon Slack, PA-C      PDMP not reviewed this encounter.   Evon Slack, PA-C 08/17/21 1014

## 2021-08-17 NOTE — ED Triage Notes (Signed)
Pt c/o productive cough, chest congestion, runny nose and body aches x 2 weeks

## 2021-08-19 ENCOUNTER — Telehealth: Payer: Self-pay | Admitting: Family Medicine

## 2021-08-19 ENCOUNTER — Other Ambulatory Visit: Payer: Self-pay | Admitting: Family Medicine

## 2021-08-19 DIAGNOSIS — R748 Abnormal levels of other serum enzymes: Secondary | ICD-10-CM

## 2021-08-19 DIAGNOSIS — E785 Hyperlipidemia, unspecified: Secondary | ICD-10-CM

## 2021-08-19 NOTE — Telephone Encounter (Signed)
Can you help with the scheduling here? Not sure how to do it for [facility]  FLP, E78.5 hyperlipidemia  HFP: elevated LFT's

## 2021-08-19 NOTE — Telephone Encounter (Signed)
Pt has a lab appt on 08/20/21 and there is no Labs ordered yet.

## 2021-08-20 ENCOUNTER — Other Ambulatory Visit (INDEPENDENT_AMBULATORY_CARE_PROVIDER_SITE_OTHER): Payer: Self-pay

## 2021-08-20 ENCOUNTER — Other Ambulatory Visit: Payer: Self-pay

## 2021-08-20 DIAGNOSIS — R748 Abnormal levels of other serum enzymes: Secondary | ICD-10-CM

## 2021-08-20 DIAGNOSIS — E785 Hyperlipidemia, unspecified: Secondary | ICD-10-CM

## 2021-08-20 DIAGNOSIS — I10 Essential (primary) hypertension: Secondary | ICD-10-CM

## 2021-08-20 LAB — HEPATIC FUNCTION PANEL
ALT: 22 U/L (ref 0–35)
AST: 13 U/L (ref 0–37)
Albumin: 4.2 g/dL (ref 3.5–5.2)
Alkaline Phosphatase: 54 U/L (ref 39–117)
Bilirubin, Direct: 0.1 mg/dL (ref 0.0–0.3)
Total Bilirubin: 0.4 mg/dL (ref 0.2–1.2)
Total Protein: 6.8 g/dL (ref 6.0–8.3)

## 2021-08-20 LAB — LIPID PANEL
Cholesterol: 239 mg/dL — ABNORMAL HIGH (ref 0–200)
HDL: 33.8 mg/dL — ABNORMAL LOW (ref >39.00)
LDL Cholesterol: 170 mg/dL — ABNORMAL HIGH (ref 0–99)
NonHDL: 205.63
Total CHOL/HDL Ratio: 7
Triglycerides: 178 mg/dL — ABNORMAL HIGH (ref 0.0–149.0)
VLDL: 35.6 mg/dL (ref 0.0–40.0)

## 2021-08-20 LAB — BASIC METABOLIC PANEL
BUN: 12 mg/dL (ref 6–23)
CO2: 24 mEq/L (ref 19–32)
Calcium: 9 mg/dL (ref 8.4–10.5)
Chloride: 105 mEq/L (ref 96–112)
Creatinine, Ser: 0.96 mg/dL (ref 0.40–1.20)
GFR: 77.78 mL/min (ref >60.00)
Glucose, Bld: 88 mg/dL (ref 70–99)
Potassium: 4 mEq/L (ref 3.5–5.1)
Sodium: 139 mEq/L (ref 135–145)

## 2021-08-20 NOTE — Addendum Note (Signed)
Addended by: Elita Boone E on: 08/20/2021 04:46 PM   Modules accepted: Orders

## 2021-08-20 NOTE — Telephone Encounter (Signed)
Patient came to get her labs done and states her neurologist needs Sodium level checked also and wanted to see if Dr Patsy Lager can add this test to her lab draw from today. Patient states Dr Patsy Lager usually orders tests for her neurologist also so patient does not have to be stuck twice. Please advise. ;lab tech said they could use her blood from today if the order gets put in.

## 2021-08-20 NOTE — Telephone Encounter (Signed)
Ok  Please add a BMP.  I do not think that I had that information.

## 2021-09-21 ENCOUNTER — Ambulatory Visit
Admission: EM | Admit: 2021-09-21 | Discharge: 2021-09-21 | Disposition: A | Discharge status: Home / Self Care | Payer: Medicaid Other | Attending: Emergency Medicine | Admitting: Emergency Medicine

## 2021-09-21 ENCOUNTER — Other Ambulatory Visit: Payer: Self-pay

## 2021-09-21 DIAGNOSIS — B349 Viral infection, unspecified: Secondary | ICD-10-CM

## 2021-09-21 NOTE — Discharge Instructions (Addendum)
We will call you with any positive results from your testing completed in clinic today.  If you do not receive a phone call from us within the next 2-3 days, check your MyChart for up-to-date health information related to testing completed in clinic today.  For most people this is a self-limiting process and can take anywhere from 7 - 10 days to start feeling better. A cough can last up to 3 weeks. Pay special attention to handwashing as this can help prevent the spread of the virus.   Always read the labels of cough and cold medications as they may contain some of the ingredients below.  Rest, push lots of fluids (especially water), and utilize supportive care for symptoms. You may take acetaminophen (Tylenol) every 4-6 hours and ibuprofen every 6-8 hours for muscle pain, joint pain, headaches (you may also alternate these medications). Mucinex (guaifenesin) may be taken over the counter for cough as needed can loosen phlegm. Please read the instructions and take as directed.  Sudafed (pseudophedrine) is sold behind the counter and can help reduce nasal pressure; avoid taking this if you have high blood pressure or feel jittery. Sudafed PE (phenylephrine) can be a helpful, short-term, over-the-counter alternative to limit side effects or if you have high blood pressure.  Flonase nasal spray can help alleviate congestion and sinus pressure. Many patients choose Afrin as a nasal decongestant; do not use for more than 3 days for risk of rebound (increased symptoms after stopping medication).  Saline nasal sprays or rinses can also help nasal congestion (use bottled or sterile water). Warm tea with lemon and honey can sooth sore throat and cough, as can cough drops.   Return to clinic for high fever not improving with medications, chest pain, difficulty breathing, non-stop vomiting, or coughing blood. Follow-up with your primary care provider if symptoms do not improve as expected in the next 5-7 days.   

## 2021-09-21 NOTE — ED Provider Notes (Addendum)
CHIEF COMPLAINT:   Chief Complaint  Patient presents with   Fever   Nasal Congestion     SUBJECTIVE/HPI:   Fever A very pleasant 35 y.o.Female presents today with cough, fever to 100.5 F and nasal congestion that started 3 days ago.  Has been treating symptoms with ibuprofen. Patient does not report any shortness of breath, chest pain, palpitations, visual changes, weakness, tingling, headache, nausea, vomiting, diarrhea, chills.   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  ROS:  Review of Systems  Constitutional:  Positive for fever.  See Subjective/HPI Medications, Allergies and Problem List personally reviewed in Epic today OBJECTIVE:   Vitals:   09/21/21 1253  BP: 109/76  Pulse: 83  Resp: 16  Temp: 98.2 F (36.8 C)  SpO2: 97%    Physical Exam   General: Appears well-developed and well-nourished. No acute distress.  HEENT Head: Normocephalic and atraumatic.   Ears: Hearing grossly intact, no drainage or visible deformity.  Nose: No nasal deviation.   Mouth/Throat: No stridor or tracheal deviation.  Non erythematous posterior pharynx noted with clear drainage present.  No white patchy exudate noted. Eyes: Conjunctivae and EOM are normal. No eye drainage or scleral icterus bilaterally.  Neck: Normal range of motion, neck is supple.  Cardiovascular: Normal rate. Regular rhythm; no murmurs, gallops, or rubs.  Pulm/Chest: No respiratory distress. Breath sounds normal bilaterally without wheezes, rhonchi, or rales.  Neurological: Alert and oriented to person, place, and time.  Skin: Skin is warm and dry.  No rashes, lesions, abrasions or bruising noted to skin.   Psychiatric: Normal mood, affect, behavior, and thought content.   Vital signs and nursing note reviewed.   Patient stable and  cooperative with examination. PROCEDURES:    LABS/X-RAYS/EKG/MEDS:   No results found for any visits on 09/21/21.  MEDICAL DECISION MAKING:   Patient presents with cough, fever to 100.5 F and nasal congestion that started 3 days ago.  Has been treating symptoms with ibuprofen. Patient does not report any shortness of breath, chest pain, palpitations, visual changes, weakness, tingling, headache, nausea, vomiting, diarrhea, chills.  COVID, influenza, RSV pending.  Given symptoms along with assessment findings, likely viral illness.  Advised about home treatment and care to include rest, fluids, Tylenol, ibuprofen, Mucinex and Sudafed.  Return to clinic for new high fever not improving with medications, chest pain, difficulty breathing, nonstop vomiting or coughing up blood.  Follow-up with PCP if not improving over the next 5 to 7 days.  Patient verbalized understanding and agreed with treatment plan.  Patient stable upon discharge. ASSESSMENT/PLAN:  1. Viral illness - COVID-19, Flu A+B and RSV ([facility]); Standing - COVID-19, Flu A+B and RSV ([facility])  Plan:   Discharge Instructions      We will call you with any positive results from your testing completed in clinic today.  If you do not receive a phone call from [place] within the next 2-3 days, check your MyChart for up-to-date health information related to testing completed in clinic today.  For most people this is a self-limiting process and can take anywhere from 7 - 10 days to start feeling better. A cough can last up to 3 weeks. Pay special attention to handwashing as this can help prevent the spread of the virus.   Always read the labels of cough and cold medications as they may contain some  of the ingredients below.  Rest, push lots of fluids (especially water), and utilize supportive care for symptoms. You may take acetaminophen (Tylenol) every 4-6 hours and ibuprofen every 6-8 hours for muscle pain, joint pain, headaches (you  may also alternate these medications). Mucinex (guaifenesin) may be taken over the counter for cough as needed can loosen phlegm. Please read the instructions and take as directed.  Sudafed (pseudophedrine) is sold behind the counter and can help reduce nasal pressure; avoid taking this if you have high blood pressure or feel jittery. Sudafed PE (phenylephrine) can be a helpful, short-term, over-the-counter alternative to limit side effects or if you have high blood pressure.  Flonase nasal spray can help alleviate congestion and sinus pressure. Many patients choose Afrin as a nasal decongestant; do not use for more than 3 days for risk of rebound (increased symptoms after stopping medication).  Saline nasal sprays or rinses can also help nasal congestion (use bottled or sterile water). Warm tea with lemon and honey can sooth sore throat and cough, as can cough drops.   Return to clinic for high fever not improving with medications, chest pain, difficulty breathing, non-stop vomiting, or coughing blood. Follow-up with your primary care provider if symptoms do not improve as expected in the next 5-7 days.          Amalia Greenhouse, FNP 09/21/21 1319    Amalia Greenhouse, FNP 09/21/21 1320

## 2021-09-21 NOTE — ED Triage Notes (Signed)
Patient presents to Urgent Care with complaints of cough, fever (100.5), and nasal congestion x 3 days ago. Treating symptoms with ibuprofen.

## 2021-09-23 LAB — COVID-19, FLU A+B AND RSV
Influenza A, NAA: DETECTED — AB
Influenza B, NAA: NOT DETECTED
RSV, NAA: NOT DETECTED
SARS-CoV-2, NAA: NOT DETECTED

## 2021-09-24 ENCOUNTER — Ambulatory Visit: Payer: Medicaid Other | Admitting: Internal Medicine

## 2021-09-25 ENCOUNTER — Other Ambulatory Visit: Payer: Self-pay

## 2021-09-25 ENCOUNTER — Ambulatory Visit (INDEPENDENT_AMBULATORY_CARE_PROVIDER_SITE_OTHER): Payer: Self-pay | Admitting: Nurse Practitioner

## 2021-09-25 VITALS — BP 114/76 | HR 75 | Temp 97.6°F | Resp 14 | Ht 63.0 in | Wt 218.2 lb

## 2021-09-25 DIAGNOSIS — R0989 Other specified symptoms and signs involving the circulatory and respiratory systems: Secondary | ICD-10-CM

## 2021-09-25 DIAGNOSIS — J3489 Other specified disorders of nose and nasal sinuses: Secondary | ICD-10-CM

## 2021-09-25 DIAGNOSIS — R051 Acute cough: Secondary | ICD-10-CM

## 2021-09-25 MED ORDER — GUAIFENESIN ER 600 MG PO TB12: 600.0000 mg | ORAL_TABLET | Freq: Two times a day (BID) | ORAL | 0 refills | Status: AC

## 2021-09-25 MED ORDER — BENZONATATE 200 MG PO CAPS
200.0000 mg | ORAL_CAPSULE | Freq: Three times a day (TID) | ORAL | 0 refills | Status: AC | PRN
Start: 2021-09-25 — End: 2021-10-05

## 2021-09-25 MED ORDER — FLUTICASONE PROPIONATE 50 MCG/ACT NA SUSP
2.0000 | Freq: Every day | NASAL | 0 refills | Status: DC
Start: 2021-09-25 — End: 2024-01-27

## 2021-09-25 NOTE — Assessment & Plan Note (Signed)
We will start patient on some take his own for rhinorrhea and sinus pressure/pain.  We will continue to monitor symptoms if no improvement in the next week reevaluate.

## 2021-09-25 NOTE — Patient Instructions (Signed)
Nice to see you today Try these medications and see if they help. If you are not improving within the next week let me know If you start running a fever or the cough changes also let me know

## 2021-09-25 NOTE — Assessment & Plan Note (Signed)
Discussed that cough, along with viruses can last up to 4 weeks.  Patient acknowledged understood we will treat cough currently do not feel patient needs a chest x-ray as lungs were clear on exam and O2 sat looks good.  Continue to monitor use Tessalon Perles as needed for cough.

## 2021-09-25 NOTE — Assessment & Plan Note (Signed)
Will write some Mucinex for patient to help break up mucus in the chest and nostril.  Drink plenty of fluid continue to monitor

## 2021-09-25 NOTE — Progress Notes (Signed)
Acute Office Visit  Subjective:    Patient ID: Caroline Fletcher, female    DOB: Apr 25, 1986, 35 y.o.   MRN: [id number]  Chief Complaint  Patient presents with   Cough    Started with body aches on 09/18/21, then had fever 09/19/21 till 09/22/21. Went to urgent care on 09/21/21-Flu positive, RSV was negative and Covid test negative. Symptoms lingering are cough-dry mainly, chest tightness, hurts to cough, sinus pain below eyes-bilateral, sinus pressure above eyes, nasal congestion.      Patient is in today for Flu as of Sunday Monday her fever broke had some improvement with symptoms minus a cough. States her chest feels tight. Ibuprofen but has not been using since Monday to help with fever  Been using albuterol inhaler with some relief  Past Medical History:  Diagnosis Date   Asthma, chronic 06/15/2013   Cleft palate    History of abnormal cervical Pap smear 2014   LGSIL with positive HRHPV   Human papilloma virus (HPV) type 9 vaccine administered    gardisil completed   Hypertension    Tourette's disorder    Dr. Zola Button    Past Surgical History:  Procedure Laterality Date   CHROMOPERTUBATION  05/03/2019   Procedure: CHROMOPERTUBATION;  Surgeon: Ward, Elenora Fender, MD;  Location: [facility];  Service: Gynecology;;   COLPOSCOPY  2009   HYSTEROSCOPY WITH D & C N/A 05/03/2019   Procedure: DILATATION AND CURETTAGE Melton Krebs;  Surgeon: Ward, Elenora Fender, MD;  Location: [facility];  Service: Gynecology;  Laterality: N/A;   INTRAUTERINE DEVICE (IUD) INSERTION N/A 05/03/2019   Procedure: INTRAUTERINE DEVICE (IUD) INSERTION;  Surgeon: Ward, Elenora Fender, MD;  Location: [facility];  Service: Gynecology;  Laterality: N/A;   LAPAROSCOPY N/A 05/03/2019   Procedure: LAPAROSCOPY DIAGNOSTIC;  Surgeon: Ward, Elenora Fender, MD;  Location: [facility];  Service: Gynecology;  Laterality: N/A;   MYRINGOTOMY     bilateral   Pallet repair  1990    Family History  Problem Relation Age of Onset   Healthy Father     Healthy Mother    Stroke Paternal Grandmother    Heart attack Paternal Grandmother    Diabetes Maternal Grandmother    Breast cancer Maternal Grandmother 41   Cancer Neg Hx     Social History   Socioeconomic History   Marital status: Married    Spouse name: Not on file   Number of children: 2   Years of education: Not on file   Highest education level: Not on file  Occupational History   Not on file  Tobacco Use   Smoking status: Never   Smokeless tobacco: Never  Vaping Use   Vaping Use: Never used  Substance and Sexual Activity   Alcohol use: No    Comment: Occasional   Drug use: No   Sexual activity: Yes    Birth control/protection: None  Other Topics Concern   Not on file  Social History Narrative   Not on file   Social Determinants of Health   Financial Resource Strain: Not on file  Food Insecurity: Not on file  Transportation Needs: Not on file  Physical Activity: Not on file  Stress: Not on file  Social Connections: Not on file  Intimate Partner Violence: Not on file    Outpatient Medications Prior to Visit  Medication Sig Dispense Refill   albuterol (VENTOLIN HFA) 108 (90 Base) MCG/ACT inhaler Inhale 1-2 puffs into the lungs every 6 (six) hours as needed for wheezing or  shortness of breath. 1 each 0   cloNIDine (CATAPRES) 0.1 MG tablet Take 0.1 mg by mouth daily.      escitalopram (LEXAPRO) 20 MG tablet Take 1 tablet by mouth daily.     loratadine (CLARITIN) 10 MG tablet Take 10 mg by mouth daily.     topiramate (TOPAMAX) 25 MG tablet Take by mouth.     neomycin-polymyxin-hydrocortisone (CORTISPORIN) 3.5-10000-1 OTIC suspension Place 4 drops into the left ear 3 (three) times daily. 10 mL 0   benzonatate (TESSALON) 100 MG capsule Take 1 capsule (100 mg total) by mouth 3 (three) times daily as needed for cough. 21 capsule 0   No facility-administered medications prior to visit.    No Known Allergies  Review of Systems  HENT:  Positive for congestion,  rhinorrhea, sinus pressure and sinus pain. Negative for ear discharge and ear pain.   Respiratory:  Positive for cough and shortness of breath.   Cardiovascular:  Negative for chest pain.  Gastrointestinal:  Positive for diarrhea. Negative for abdominal pain, nausea and vomiting.  Musculoskeletal:  Negative for arthralgias and myalgias.  Neurological:  Positive for headaches.      Objective:    Physical Exam Vitals and nursing note reviewed.  Constitutional:      Appearance: Normal appearance.  HENT:     Right Ear: Tympanic membrane, ear canal and external ear normal. There is no impacted cerumen.     Left Ear: Tympanic membrane, ear canal and external ear normal. There is no impacted cerumen.     Mouth/Throat:     Mouth: Mucous membranes are moist.     Pharynx: Oropharynx is clear. No oropharyngeal exudate or posterior oropharyngeal erythema.  Eyes:     Extraocular Movements: Extraocular movements intact.     Pupils: Pupils are equal, round, and reactive to light.  Cardiovascular:     Rate and Rhythm: Normal rate and regular rhythm.  Pulmonary:     Effort: Pulmonary effort is normal.     Breath sounds: Normal breath sounds.  Lymphadenopathy:     Cervical: No cervical adenopathy.  Neurological:     Mental Status: She is alert.    BP 114/76   Pulse 75   Temp 97.6 F (36.4 C)   Resp 14   Ht 5\' 3"  (1.6 m)   Wt 218 lb 4 oz (99 kg)   LMP 08/26/2021   SpO2 95%   Breastfeeding No   BMI 38.66 kg/m  Wt Readings from Last 3 Encounters:  09/25/21 218 lb 4 oz (99 kg)  04/04/21 213 lb (96.6 kg)  01/30/21 213 lb 8 oz (96.8 kg)    Health Maintenance Due  Topic Date Due   Pneumococcal Vaccine 44-28 Years old (1 - PCV) Never done   PAP SMEAR-Modifier  09/26/2021    There are no preventive care reminders to display for this patient.   Lab Results  Component Value Date   TSH 2.65 11/27/2020   Lab Results  Component Value Date   WBC 7.7 11/27/2020   HGB 13.4 11/27/2020    HCT 39.5 11/27/2020   MCV 82.9 11/27/2020   PLT 278.0 11/27/2020   Lab Results  Component Value Date   NA 139 08/20/2021   K 4.0 08/20/2021   CO2 24 08/20/2021   GLUCOSE 88 08/20/2021   BUN 12 08/20/2021   CREATININE 0.96 08/20/2021   BILITOT 0.4 08/20/2021   ALKPHOS 54 08/20/2021   AST 13 08/20/2021   ALT 22 08/20/2021  PROT 6.8 08/20/2021   ALBUMIN 4.2 08/20/2021   CALCIUM 9.0 08/20/2021   ANIONGAP 9 05/03/2019   GFR 77.78 08/20/2021   Lab Results  Component Value Date   CHOL 239 (H) 08/20/2021   Lab Results  Component Value Date   HDL 33.80 (L) 08/20/2021   Lab Results  Component Value Date   LDLCALC 170 (H) 08/20/2021   Lab Results  Component Value Date   TRIG 178.0 (H) 08/20/2021   Lab Results  Component Value Date   CHOLHDL 7 08/20/2021   Lab Results  Component Value Date   HGBA1C 5.4 11/27/2020       Assessment & Plan:   Problem List Items Addressed This Visit       Respiratory   Chest congestion    Will write some Mucinex for patient to help break up mucus in the chest and nostril.  Drink plenty of fluid continue to monitor      Relevant Medications   guaiFENesin (MUCINEX) 600 MG 12 hr tablet     Other   Rhinorrhea    We will start patient on some take his own for rhinorrhea and sinus pressure/pain.  We will continue to monitor symptoms if no improvement in the next week reevaluate.      Relevant Medications   fluticasone (FLONASE) 50 MCG/ACT nasal spray   Acute cough - Primary    Discussed that cough, along with viruses can last up to 4 weeks.  Patient acknowledged understood we will treat cough currently do not feel patient needs a chest x-ray as lungs were clear on exam and O2 sat looks good.  Continue to monitor use Tessalon Perles as needed for cough.      Relevant Medications   benzonatate (TESSALON) 200 MG capsule     No orders of the defined types were placed in this encounter.  This visit occurred during the  SARS-CoV-2 public health emergency.  Safety protocols were in place, including screening questions prior to the visit, additional usage of staff PPE, and extensive cleaning of exam room while observing appropriate contact time as indicated for disinfecting solutions.   Audria Nine, NP

## 2021-09-26 ENCOUNTER — Telehealth: Payer: Medicaid Other | Admitting: Family Medicine

## 2021-11-03 ENCOUNTER — Encounter: Payer: Self-pay | Admitting: Emergency Medicine

## 2021-11-03 ENCOUNTER — Other Ambulatory Visit: Payer: Self-pay

## 2021-11-03 ENCOUNTER — Ambulatory Visit
Admission: EM | Admit: 2021-11-03 | Discharge: 2021-11-03 | Disposition: A | Discharge status: Home / Self Care | Payer: Self-pay

## 2021-11-03 DIAGNOSIS — R1030 Lower abdominal pain, unspecified: Secondary | ICD-10-CM

## 2021-11-03 DIAGNOSIS — B349 Viral infection, unspecified: Secondary | ICD-10-CM

## 2021-11-03 LAB — POCT URINALYSIS DIP (MANUAL ENTRY)
Bilirubin, UA: NEGATIVE
Blood, UA: NEGATIVE
Glucose, UA: NEGATIVE mg/dL
Ketones, POC UA: NEGATIVE mg/dL
Nitrite, UA: NEGATIVE
Protein Ur, POC: NEGATIVE mg/dL
Spec Grav, UA: 1.015 (ref 1.010–1.025)
Urobilinogen, UA: 0.2 E.U./dL
pH, UA: 6 (ref 5.0–8.0)

## 2021-11-03 LAB — POCT URINE PREGNANCY: Preg Test, Ur: NEGATIVE

## 2021-11-03 LAB — POCT INFLUENZA A/B
Influenza A, POC: NEGATIVE
Influenza B, POC: NEGATIVE

## 2021-11-03 NOTE — ED Triage Notes (Signed)
Pt c/o fever, bodyaches, left ear pain, runny nose x 1 week. Pt also has lower abd cramping.

## 2021-11-03 NOTE — Discharge Instructions (Addendum)
Your urine does not show infection.  Your pregnancy test is negative.    Your flu test is negative.  Your COVID test is pending.    Take Tylenol or ibuprofen as needed for fever or discomfort.  Rest and keep yourself hydrated.  Follow up with your primary care provider if your symptoms are not improving.

## 2021-11-03 NOTE — ED Provider Notes (Signed)
[facility]    CSN: [id number] Arrival date & time: 11/03/21  1700      History   Chief Complaint Chief Complaint  Patient presents with   Generalized Body Aches   Fever   Otalgia    HPI Caroline Fletcher is a 35 y.o. female.  Patient presents with 3 day history of fever, body aches, runny nose, congestion, ear pain, cough, lower abdominal cramps, low back pain.  Treatment at home with ibuprofen.  No rash, shortness of breath, vomiting, diarrhea, dysuria, hematuria, or other symptoms.  Her medical history includes asthma, Tourette's, hypertension.  LMP: October 2022.  Patient requests check for UTI.    The history is provided by the patient and medical records.   Past Medical History:  Diagnosis Date   Asthma, chronic 06/15/2013   Cleft palate    History of abnormal cervical Pap smear 2014   LGSIL with positive HRHPV   Human papilloma virus (HPV) type 9 vaccine administered    gardisil completed   Hypertension    Tourette's disorder    Dr. Tonye Royalty    Patient Active Problem List   Diagnosis Date Noted   Rhinorrhea 09/25/2021   Chest congestion 09/25/2021   Acute cough 09/25/2021   Hypertension 03/16/2018   Obesity (BMI 35.0-39.9 without comorbidity) 01/06/2018   Oligomenorrhea 09/08/2017   Asthma, chronic 06/15/2013   TOURETTE'S DISORDER ([facility]) 03/07/2008    Past Surgical History:  Procedure Laterality Date   CHROMOPERTUBATION  05/03/2019   Procedure: CHROMOPERTUBATION;  Surgeon: Ward, Honor Loh, MD;  Location: [facility];  Service: Gynecology;;   COLPOSCOPY  2009   HYSTEROSCOPY WITH D & C N/A 05/03/2019   Procedure: DILATATION AND CURETTAGE /HYSTEROSCOPY;  Surgeon: Ward, Honor Loh, MD;  Location: [facility];  Service: Gynecology;  Laterality: N/A;   INTRAUTERINE DEVICE (IUD) INSERTION N/A 05/03/2019   Procedure: INTRAUTERINE DEVICE (IUD) INSERTION;  Surgeon: Ward, Honor Loh, MD;  Location: [facility];  Service: Gynecology;  Laterality: N/A;   LAPAROSCOPY N/A  05/03/2019   Procedure: LAPAROSCOPY DIAGNOSTIC;  Surgeon: Ward, Honor Loh, MD;  Location: [facility];  Service: Gynecology;  Laterality: N/A;   MYRINGOTOMY     bilateral   Pallet repair  1990    OB History     Gravida  4   Para  2   Term  2   Preterm      AB  1   Living  2      SAB  1   IAB      Ectopic      Multiple  0   Live Births  2            Home Medications    Prior to Admission medications   Medication Sig Start Date End Date Taking? Authorizing Provider  topiramate (TOPAMAX) 25 MG tablet Take by mouth. 10/21/21  Yes [provider]  albuterol (VENTOLIN HFA) 108 (90 Base) MCG/ACT inhaler Inhale 1-2 puffs into the lungs every 6 (six) hours as needed for wheezing or shortness of breath. 08/17/21   Duanne Guess, PA-C  cloNIDine (CATAPRES) 0.1 MG tablet Take 0.1 mg by mouth daily.     [provider]  escitalopram (LEXAPRO) 20 MG tablet Take 1 tablet by mouth daily. 01/22/21   [provider]  fluticasone (FLONASE) 50 MCG/ACT nasal spray Place 2 sprays into both nostrils daily. 09/25/21   Michela Pitcher, NP  loratadine (CLARITIN) 10 MG tablet Take 10 mg by mouth  daily.    [provider]  loratadine (CLARITIN) 10 MG tablet Take 1 tablet by mouth daily.    [provider]  topiramate (TOPAMAX) 25 MG tablet Take by mouth. 06/25/21   [provider]    Family History Family History  Problem Relation Age of Onset   Healthy Father    Healthy Mother    Stroke Paternal Grandmother    Heart attack Paternal Grandmother    Diabetes Maternal Grandmother    Breast cancer Maternal Grandmother 49   Cancer Neg Hx     Social History Social History   Tobacco Use   Smoking status: Never   Smokeless tobacco: Never  Vaping Use   Vaping Use: Never used  Substance Use Topics   Alcohol use: No    Comment: Occasional   Drug use: No     Allergies   Patient has no known allergies.   Review of  Systems Review of Systems  Constitutional:  Positive for chills and fever.  HENT:  Positive for ear pain. Negative for sore throat.   Respiratory:  Positive for cough. Negative for shortness of breath.   Cardiovascular:  Negative for chest pain and palpitations.  Gastrointestinal:  Positive for abdominal pain. Negative for diarrhea and vomiting.  Genitourinary:  Negative for dysuria and hematuria.  Musculoskeletal:  Positive for back pain. Negative for gait problem.  Skin:  Negative for color change and rash.  All other systems reviewed and are negative.   Physical Exam Triage Vital Signs ED Triage Vitals  Enc Vitals Group     BP      Pulse      Resp      Temp      Temp src      SpO2      Weight      Height      Head Circumference      Peak Flow      Pain Score      Pain Loc      Pain Edu?      Excl. in [place]?    No data found.  Updated Vital Signs BP 127/80 (BP Location: Left Arm)   Pulse 95   Temp 98.6 F (37 C)   Resp 18   LMP  (LMP Unknown)   SpO2 96%   Visual Acuity Right Eye Distance:   Left Eye Distance:   Bilateral Distance:    Right Eye Near:   Left Eye Near:    Bilateral Near:     Physical Exam Vitals and nursing note reviewed.  Constitutional:      General: She is not in acute distress.    Appearance: She is well-developed. She is obese.  HENT:     Right Ear: Tympanic membrane normal.     Left Ear: Tympanic membrane normal.     Nose: Nose normal.     Mouth/Throat:     Mouth: Mucous membranes are moist.     Pharynx: Oropharynx is clear.  Cardiovascular:     Rate and Rhythm: Normal rate and regular rhythm.     Heart sounds: Normal heart sounds.  Pulmonary:     Effort: Pulmonary effort is normal. No respiratory distress.     Breath sounds: Normal breath sounds.  Abdominal:     General: Bowel sounds are normal.     Palpations: Abdomen is soft.     Tenderness: There is no abdominal tenderness. There is no right CVA tenderness, left CVA  tenderness, guarding or rebound.  Musculoskeletal:     Cervical back: Neck supple.  Skin:    General: Skin is warm and dry.  Neurological:     Mental Status: She is alert.  Psychiatric:        Mood and Affect: Mood normal.        Behavior: Behavior normal.     UC Treatments / Results  Labs (all labs ordered are listed, but only abnormal results are displayed) Labs Reviewed  POCT URINALYSIS DIP (MANUAL ENTRY) - Abnormal; Notable for the following components:      Result Value   Leukocytes, UA Trace (*)    All other components within normal limits  NOVEL CORONAVIRUS, NAA  POCT INFLUENZA A/B  POCT URINE PREGNANCY    EKG   Radiology No results found.  Procedures Procedures (including critical care time)  Medications Ordered in UC Medications - No data to display  Initial Impression / Assessment and Plan / UC Course  I have reviewed the triage vital signs and the nursing notes.  Pertinent labs & imaging results that were available during my care of the patient were reviewed by me and considered in my medical decision making (see chart for details).   Viral illness, abdominal pain.  Urine pregnancy negative.  Urine does not indicate infection. Rapid flu negative.  COVID pending.  Instructed patient to self quarantine per CDC guidelines.  Discussed symptomatic treatment including Tylenol or ibuprofen, rest, hydration.  Instructed patient to follow up with PCP if symptoms are not improving.  Patient agrees to plan of care.    Final Clinical Impressions(s) / UC Diagnoses   Final diagnoses:  Viral illness  Lower abdominal pain     Discharge Instructions      Your urine does not show infection.  Your pregnancy test is negative.    Your flu test is negative.  Your COVID test is pending.    Take Tylenol or ibuprofen as needed for fever or discomfort.  Rest and keep yourself hydrated.  Follow up with your primary care provider if your symptoms are not improving.           ED Prescriptions   None    PDMP not reviewed this encounter.   Mickie Bail, NP 11/03/21 [phone]

## 2021-11-04 LAB — NOVEL CORONAVIRUS, NAA: SARS-CoV-2, NAA: DETECTED — AB

## 2022-11-26 IMAGING — MG DIGITAL DIAGNOSTIC BILAT W/ TOMO W/ CAD
6 of 9 series · 6 of 25 positions shown · non-contrast
Comparison: Previous exam(s).

CLINICAL DATA: 34-year-old female presenting for evaluation of a
palpable lump in the lateral right breast. The patient states that
this area is occasionally tender.

EXAM:
DIGITAL DIAGNOSTIC BILATERAL MAMMOGRAM WITH TOMO AND CAD; ULTRASOUND
RIGHT BREAST LIMITED

[R MLO synth-2D]
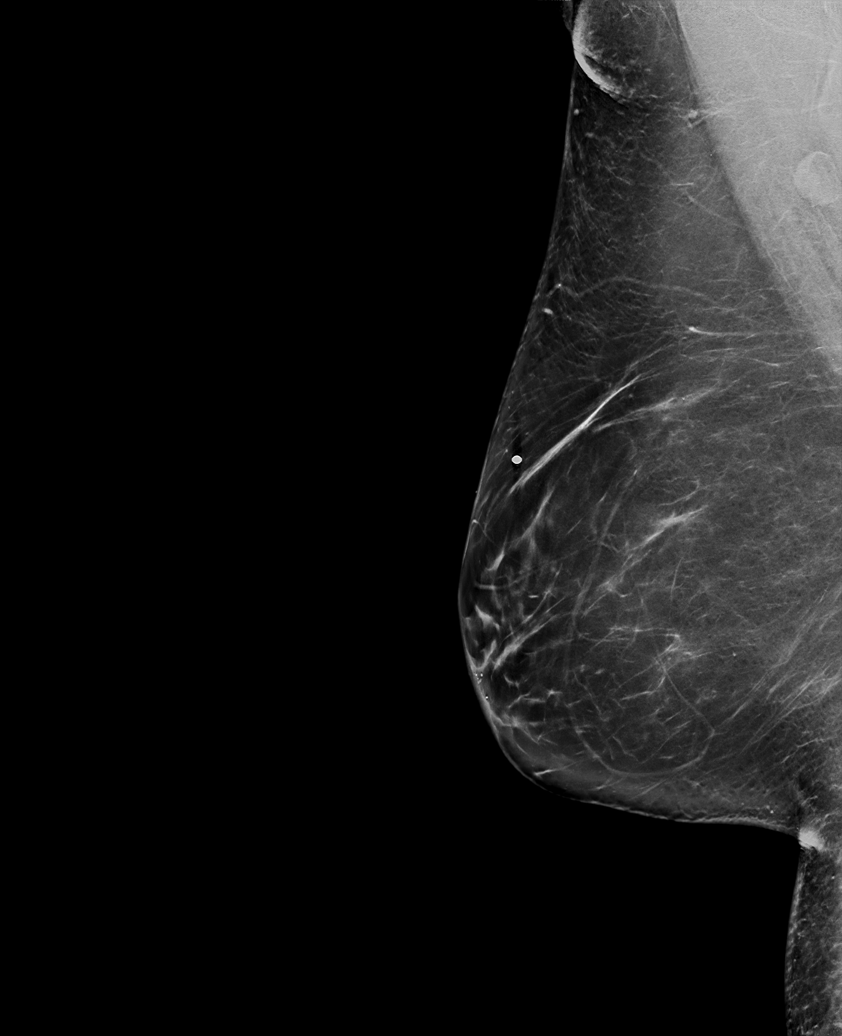

[L CC synth-2D]
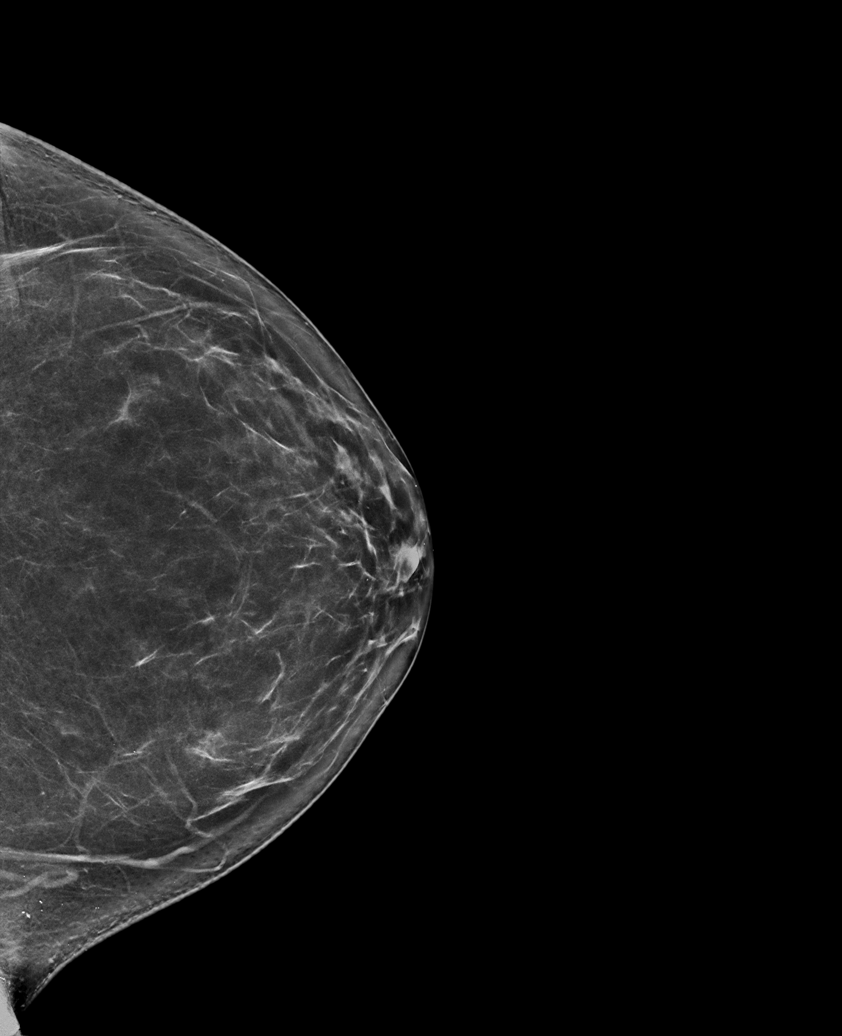

[R CC synth-2D (1 of 2)]
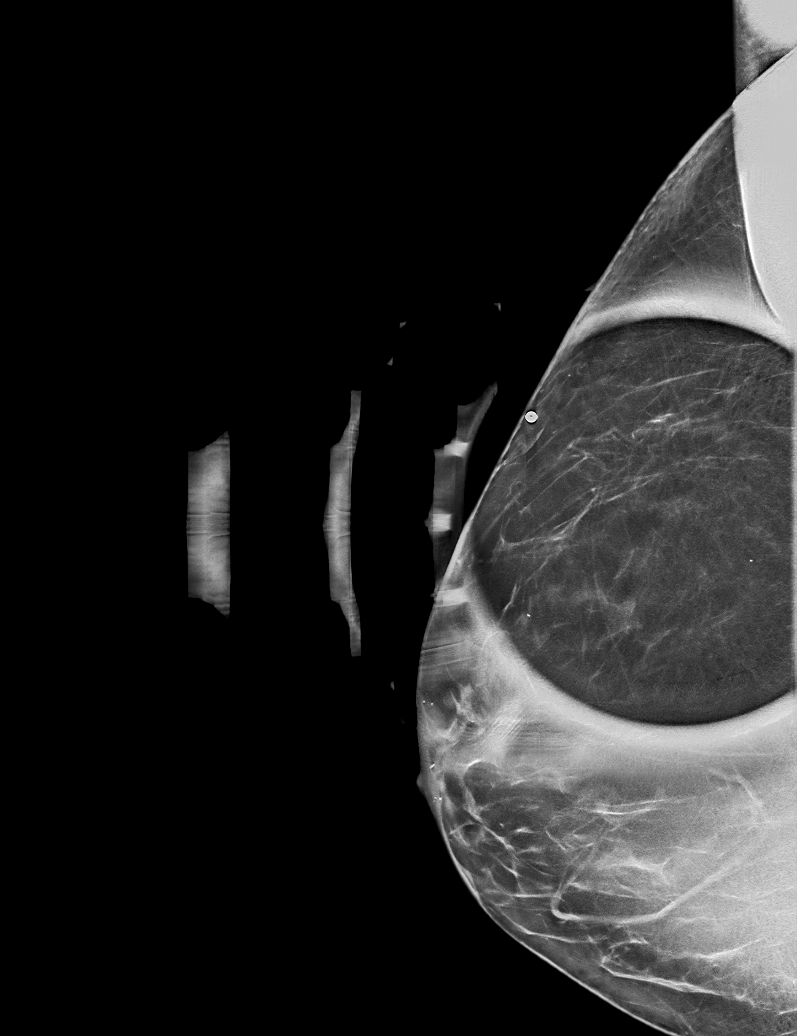

[L MLO synth-2D]
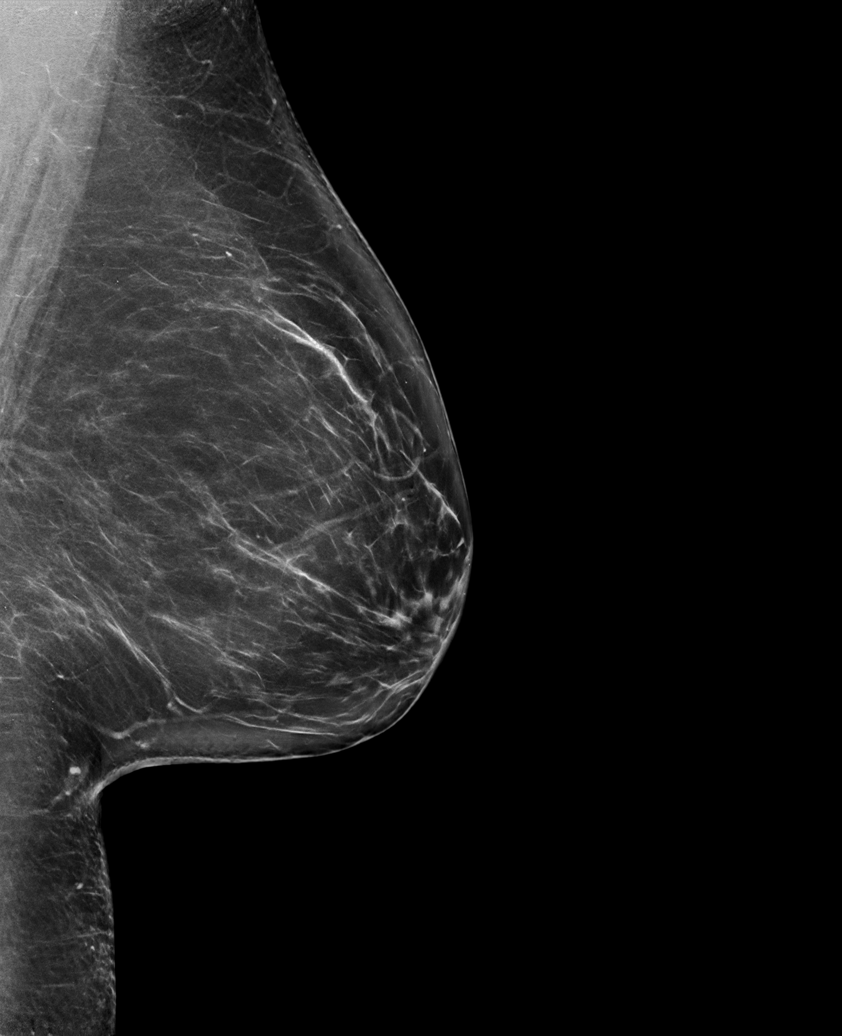

[R CC synth-2D (2 of 2)]
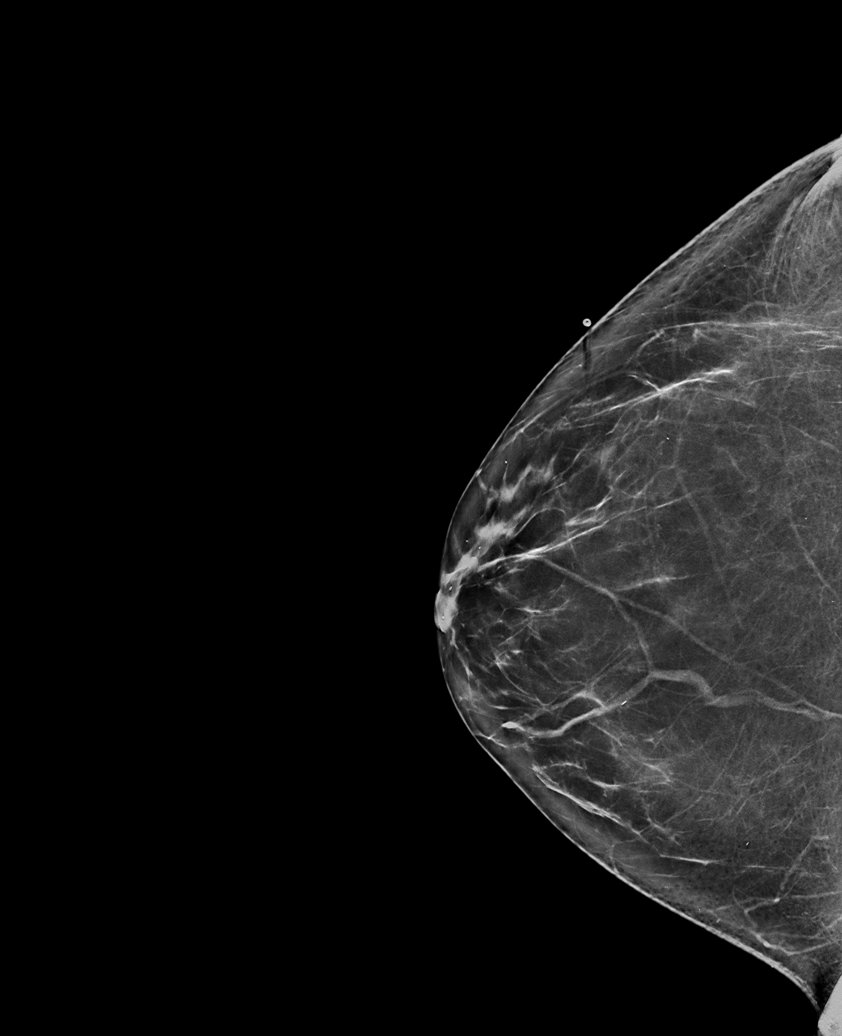

[R CC tomo · tomo slice 31/62.0]
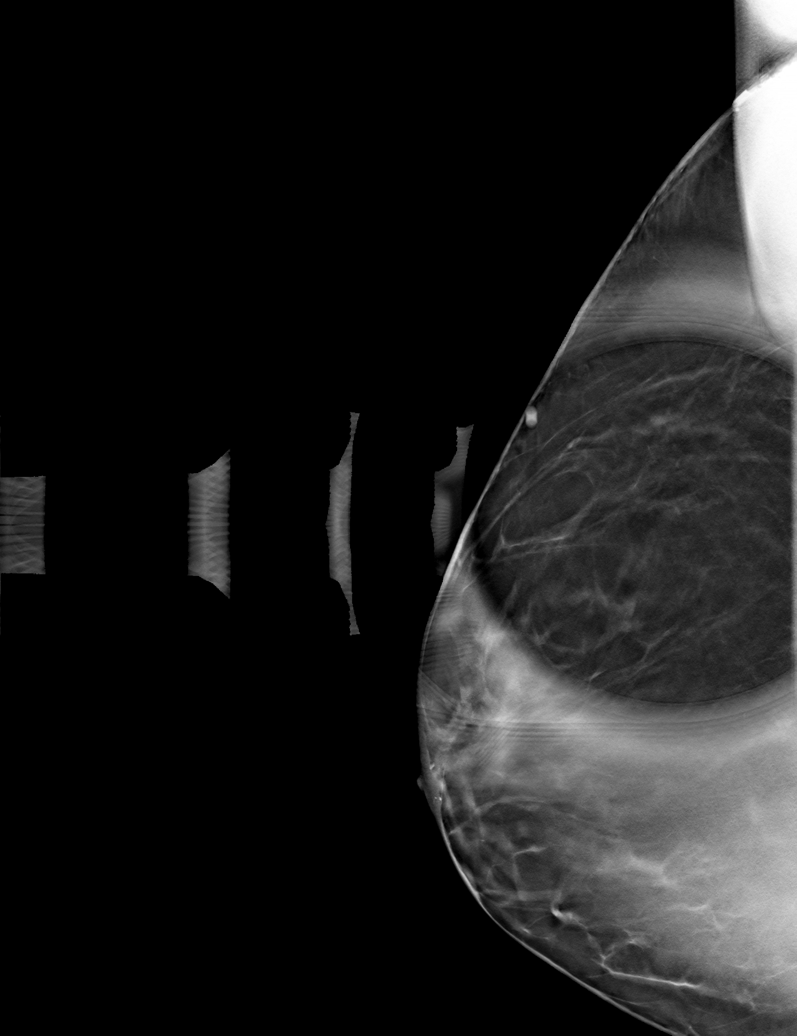

[6 of 25 positions shown; findings below may reference images not displayed]

ACR Breast Density Category b: There are scattered areas of
fibroglandular density.
FINDINGS: A BB indicating the palpable tender site of concern has been placed
on the lateral aspect of the right breast. No suspicious
mammographic findings are identified deep to the palpable marker.
There is an oval lucent area spanning approximately with 2.2 cm that
may represent a lipoma versus a normal fat lobule. Ultrasound will
be performed for further evaluation. No suspicious calcifications,
masses or areas of distortion are seen in the bilateral breasts.

Mammographic images were processed with CAD.

Physical exam of the palpable site in the upper-outer quadrant of
the right breast demonstrates normal fibroglandular tissue. No
suspicious masses or areas of shadowing are identified.
IMPRESSION: 1. There are no mammographic or targeted sonographic abnormalities
at the palpable site of concern in the upper-outer right breast.

2.  No mammographic evidence of malignancy in the bilateral breasts.

RECOMMENDATION:
Screening mammogram at age 42 unless there are persistent or
intervening clinical concerns. (Code:[id number]-[id number])

I have discussed the findings and recommendations with the patient.
If applicable, a reminder letter will be sent to the patient
regarding the next appointment.

BI-RADS CATEGORY  1: Negative.

## 2024-01-22 ENCOUNTER — Encounter: Payer: Self-pay | Admitting: Emergency Medicine

## 2024-01-22 ENCOUNTER — Other Ambulatory Visit: Payer: Self-pay

## 2024-01-22 DIAGNOSIS — N390 Urinary tract infection, site not specified: Secondary | ICD-10-CM | POA: Insufficient documentation

## 2024-01-22 DIAGNOSIS — R197 Diarrhea, unspecified: Secondary | ICD-10-CM | POA: Insufficient documentation

## 2024-01-22 LAB — CBC
HCT: 32.5 % — ABNORMAL LOW (ref 36.0–46.0)
Hemoglobin: 10 g/dL — ABNORMAL LOW (ref 12.0–15.0)
MCH: 25.3 pg — ABNORMAL LOW (ref 26.0–34.0)
MCHC: 30.8 g/dL (ref 30.0–36.0)
MCV: 82.1 fL (ref 80.0–100.0)
Platelets: 267 10*3/uL (ref 150–400)
RBC: 3.96 MIL/uL (ref 3.87–5.11)
RDW: 13.6 % (ref 11.5–15.5)
WBC: 7.3 10*3/uL (ref 4.0–10.5)
nRBC: 0 % (ref 0.0–0.2)

## 2024-01-22 LAB — URINALYSIS, ROUTINE W REFLEX MICROSCOPIC
Bilirubin Urine: NEGATIVE
Glucose, UA: NEGATIVE mg/dL
Hgb urine dipstick: NEGATIVE
Ketones, ur: NEGATIVE mg/dL
Nitrite: NEGATIVE
Protein, ur: NEGATIVE mg/dL
Specific Gravity, Urine: 1.019 (ref 1.005–1.030)
pH: 5 (ref 5.0–8.0)

## 2024-01-22 LAB — COMPREHENSIVE METABOLIC PANEL
ALT: 24 U/L (ref 0–44)
AST: 20 U/L (ref 15–41)
Albumin: 3.6 g/dL (ref 3.5–5.0)
Alkaline Phosphatase: 50 U/L (ref 38–126)
Anion gap: 7 (ref 5–15)
BUN: 15 mg/dL (ref 6–20)
CO2: 19 mmol/L — ABNORMAL LOW (ref 22–32)
Calcium: 8.4 mg/dL — ABNORMAL LOW (ref 8.9–10.3)
Chloride: 112 mmol/L — ABNORMAL HIGH (ref 98–111)
Creatinine, Ser: 0.84 mg/dL (ref 0.44–1.00)
GFR, Estimated: >60 mL/min (ref >60)
Glucose, Bld: 115 mg/dL — ABNORMAL HIGH (ref 70–99)
Potassium: 3.9 mmol/L (ref 3.5–5.1)
Sodium: 138 mmol/L (ref 135–145)
Total Bilirubin: 0.4 mg/dL (ref 0.0–1.2)
Total Protein: 6.3 g/dL — ABNORMAL LOW (ref 6.5–8.1)

## 2024-01-22 LAB — LIPASE, BLOOD: Lipase: 33 U/L (ref 11–51)

## 2024-01-22 LAB — POC URINE PREG, ED: Preg Test, Ur: NEGATIVE

## 2024-01-22 NOTE — ED Notes (Addendum)
 Pt now has checked in to be seen for abd pain, son is also waiting to be seen, pt's son was checked in 3.38 hrs ago, still waiting to be seen, no pt would like to be seen when her son is called back to a room. Pt here for abd pain, pt has been noted to have not acute distress, has came up to first nurse several several time to inquire about wait time for son. Has not spoken of abd pain for herself until now, pt wanted to know if being seen with her son would make the process go quicker.

## 2024-01-22 NOTE — ED Triage Notes (Signed)
 Pt in via POV, reports diarrhea x one day, denies any N/V.  Endorses bloating and generalized abdominal pain.  Ambulatory to triage, NAD noted at this time.

## 2024-01-23 ENCOUNTER — Emergency Department
Admission: EM | Admit: 2024-01-23 | Discharge: 2024-01-23 | Disposition: A | Discharge status: Home / Self Care | Payer: Self-pay | Attending: Emergency Medicine | Admitting: Emergency Medicine

## 2024-01-23 DIAGNOSIS — N390 Urinary tract infection, site not specified: Secondary | ICD-10-CM

## 2024-01-23 DIAGNOSIS — R112 Nausea with vomiting, unspecified: Secondary | ICD-10-CM

## 2024-01-23 MED ORDER — ONDANSETRON 4 MG PO TBDP: ORAL_TABLET | ORAL | 0 refills | Status: DC

## 2024-01-23 MED ORDER — DICYCLOMINE HCL 10 MG PO CAPS
10.0000 mg | ORAL_CAPSULE | Freq: Once | ORAL | Status: AC
  Administered 2024-01-23: 10 mg via ORAL
  Filled 2024-01-23: qty 1

## 2024-01-23 MED ORDER — CIPROFLOXACIN HCL 500 MG PO TABS
500.0000 mg | ORAL_TABLET | ORAL | Status: AC
  Administered 2024-01-23: 500 mg via ORAL
  Filled 2024-01-23: qty 1

## 2024-01-23 MED ORDER — ONDANSETRON 4 MG PO TBDP
4.0000 mg | ORAL_TABLET | Freq: Once | ORAL | Status: AC
  Administered 2024-01-23: 4 mg via ORAL
  Filled 2024-01-23: qty 1

## 2024-01-23 MED ORDER — CIPROFLOXACIN HCL 500 MG PO TABS: 500.0000 mg | ORAL_TABLET | Freq: Two times a day (BID) | ORAL | 0 refills | Status: AC

## 2024-01-23 MED ORDER — DICYCLOMINE HCL 10 MG PO CAPS: 10.0000 mg | ORAL_CAPSULE | Freq: Three times a day (TID) | ORAL | 0 refills | Status: DC | PRN

## 2024-01-23 MED ORDER — FOSFOMYCIN TROMETHAMINE 3 G PO PACK
3.0000 g | PACK | Freq: Once | ORAL | Status: AC
  Administered 2024-01-23: 3 g via ORAL
  Filled 2024-01-23: qty 3

## 2024-01-23 NOTE — Discharge Instructions (Signed)
 We believe your symptoms are caused by either a viral infection or possibly a bad food exposure.  Either way, since your symptoms have improved, we feel it is safe for you to go home and follow up with your regular doctor.  Please read the included information and stick to a bland diet for the next two days.  Drink plenty of clear fluids, and if you were provided with a prescription, please take it according to the label instructions.    If you develop any new or worsening symptoms, including persistent vomiting not controlled with medication, fever greater than 101, severe or worsening abdominal pain, or other symptoms that concern you, please return immediately to the Emergency Department.

## 2024-01-23 NOTE — ED Provider Notes (Signed)
 [facility] Provider Note    Event Date/Time   First MD Initiated Contact with Patient 01/23/24 0032     (approximate)   History   Diarrhea   HPI Caroline Fletcher is a 38 y.o. female  Who presents for evaluation of about 24 hours of abdominal bloating, "sulfur burps", persistent nausea, and copious watery diarrhea.  It started not long after she ate about 24 hours ago, but no one else in the family has been ill.  She said that she has some generalized cramping and discomfort but the feeling of bloating and the sulfur burps are the worst of it.  She has not vomited recently but continues to feel some nausea.  No fever, chest pain, nor respiratory symptoms.     Physical Exam   Triage Vital Signs: ED Triage Vitals  Encounter Vitals Group     BP 01/22/24 2201 128/75     Systolic BP Percentile --      Diastolic BP Percentile --      Pulse Rate 01/22/24 2201 73     Resp 01/22/24 2201 20     Temp 01/22/24 2201 97.9 F (36.6 C)     Temp Source 01/22/24 2201 Oral     SpO2 01/22/24 2201 97 %     Weight --      Height --      Head Circumference --      Peak Flow --      Pain Score 01/22/24 2212 4     Pain Loc --      Pain Education --      Exclude from Growth Chart --     Most recent vital signs: Vitals:   01/22/24 2201 01/23/24 0047  BP: 128/75 120/78  Pulse: 73 71  Resp: 20 18  Temp: 97.9 F (36.6 C) 98 F (36.7 C)  SpO2: 97% 98%    General: Awake, no distress, generally well-appearing despite history of present illness. CV:  Good peripheral perfusion.  Regular rate and rhythm. Resp:  Normal effort. Speaking easily and comfortably, no accessory muscle usage nor intercostal retractions.   Abd:  No distention.  Mild generalized discomfort upon palpation, but with no localized tenderness and certainly no peritonitis.  No guarding.   ED Results / Procedures / Treatments   Labs (all labs ordered are listed, but only abnormal results are  displayed) Labs Reviewed  COMPREHENSIVE METABOLIC PANEL - Abnormal; Notable for the following components:      Result Value   Chloride 112 (*)    CO2 19 (*)    Glucose, Bld 115 (*)    Calcium 8.4 (*)    Total Protein 6.3 (*)    All other components within normal limits  CBC - Abnormal; Notable for the following components:   Hemoglobin 10.0 (*)    HCT 32.5 (*)    MCH 25.3 (*)    All other components within normal limits  URINALYSIS, ROUTINE W REFLEX MICROSCOPIC - Abnormal; Notable for the following components:   Color, Urine YELLOW (*)    APPearance HAZY (*)    Leukocytes,Ua MODERATE (*)    Bacteria, UA RARE (*)    All other components within normal limits  URINE CULTURE  LIPASE, BLOOD  POC URINE PREG, ED     PROCEDURES:  Critical Care performed: No  Procedures    IMPRESSION / MDM / ASSESSMENT AND PLAN / ED COURSE  I reviewed the triage vital signs and the nursing notes.  Differential diagnosis includes, but is not limited to, viral gastroenteritis, foodborne pathogen, diverticulitis, appendicitis, SBO/ileus.  Patient's presentation is most consistent with acute presentation with potential threat to life or bodily function.  Labs/studies ordered: CMP, lipase, urinalysis, CBC, urine pregnancy test  Interventions/Medications given:  Medications  dicyclomine (BENTYL) capsule 10 mg (10 mg Oral Given 01/23/24 0046)  ondansetron (ZOFRAN-ODT) disintegrating tablet 4 mg (4 mg Oral Given 01/23/24 0046)  fosfomycin (MONUROL) packet 3 g (3 g Oral Given 01/23/24 0128)  ciprofloxacin (CIPRO) tablet 500 mg (500 mg Oral Given 01/23/24 0128)    (Note:  hospital course my include additional interventions and/or labs/studies not listed above.)   Incidentally patient has a UTI as demonstrated on UA.  She states she is prone to UTIs and would like to treated empirically because she has gotten quite ill in the past from not treating 1.  Physical exam is  quite reassuring as is her lab workup (other than UA).  No substantial tenderness to palpation, patient is tolerating oral intake and walking around the room with no apparent distress.  I talked with her about the probability of viral/foodborne gastroenteritis to explain her primary symptoms of nausea, vomiting, and diarrhea.  I do not feel she would benefit from any imaging at this time.  SBO/ileus is extremely unlikely, and she has no localized peritonitis and the symptoms are strongly suggestive of gastroenteritis.  Given the commendation of a positive UTI and the gastroenteritis, Cipro might be a good choice.  However given the prevalence of fluoroquinolone resistance in the community, I offered a one-time dose of ceftriaxone IM and a prescription for ciprofloxacin.  She does not want an injection but agreed to a dose of fosfomycin which should be a reasonable one-time treatment in and of itself, to be followed as well by the ciprofloxacin as an outpatient.  She will follow-up with her primary care doctor and I gave my usual and customary ED return precautions.       FINAL CLINICAL IMPRESSION(S) / ED DIAGNOSES   Final diagnoses:  Nausea vomiting and diarrhea  Urinary tract infection without hematuria, site unspecified     Rx / DC Orders   ED Discharge Orders          Ordered    dicyclomine (BENTYL) 10 MG capsule  3 times daily PRN        01/23/24 0041    ondansetron (ZOFRAN-ODT) 4 MG disintegrating tablet        01/23/24 0041    ciprofloxacin (CIPRO) 500 MG tablet  2 times daily        01/23/24 0132    ondansetron (ZOFRAN-ODT) 4 MG disintegrating tablet        01/23/24 0132    dicyclomine (BENTYL) 10 MG capsule  3 times daily PRN        01/23/24 0132             Note:  This document was prepared using Dragon voice recognition software and may include unintentional dictation errors.   [name], MD 01/23/24 [phone]

## 2024-01-24 LAB — URINE CULTURE: Culture: 10000 — AB

## 2024-01-27 ENCOUNTER — Telehealth: Payer: Self-pay | Admitting: *Deleted

## 2024-01-27 ENCOUNTER — Encounter: Payer: Self-pay | Admitting: Family Medicine

## 2024-01-27 ENCOUNTER — Ambulatory Visit (INDEPENDENT_AMBULATORY_CARE_PROVIDER_SITE_OTHER): Payer: Self-pay | Admitting: Family Medicine

## 2024-01-27 VITALS — BP 110/60 | HR 86 | Temp 98.0°F | Ht 62.75 in | Wt 231.2 lb

## 2024-01-27 DIAGNOSIS — E860 Dehydration: Secondary | ICD-10-CM

## 2024-01-27 DIAGNOSIS — E782 Mixed hyperlipidemia: Secondary | ICD-10-CM

## 2024-01-27 DIAGNOSIS — E038 Other specified hypothyroidism: Secondary | ICD-10-CM

## 2024-01-27 DIAGNOSIS — D508 Other iron deficiency anemias: Secondary | ICD-10-CM

## 2024-01-27 NOTE — Patient Instructions (Signed)
 Iron sulfate 325 mg once a day  (Iron gluconate could be used instead)

## 2024-01-27 NOTE — Telephone Encounter (Signed)
 Copied from CRM [id number]. Topic: General - Other >> Jan 27, 2024 12:18 PM Jon Gills C wrote: Reason for CRM: Patient went to the Hospital and need to be schedule for a hospital followup  Wanted to see Dr.Copland to see what she what she should do as there isnt any available appointments until Monday and she will be back ins [place] by then. She is requesting for someone to give her a call on this matter

## 2024-01-27 NOTE — Telephone Encounter (Signed)
 I spoke with pt pt said she was seen at ED on 01/23/24 and was given abx for diarrhea and diarrhea is gone. Pt is concerned about low iron level and other abnormal labs from ED. Pt recently moved to [facility] and hoped to see Dr Patsy Lager while still here. Pt last saw Dr Alferd Patee 01/30/2021 and Audria Nine NP on 09/25/2021 for cough. Pt scheduled appt with DR Copland 01/27/24 at 2:40; pt said they still have family here and come to this area often. Sending note to Dr Patsy Lager and [facility] pool.

## 2024-01-27 NOTE — Progress Notes (Signed)
 Maitland Lesiak T. Dmonte Maher, MD, CAQ Sports Medicine [facility] at [facility] [address] [place], [address]  Phone: [phone]  FAX: [phone]  Caroline Fletcher - 38 y.o. female  MRN [id number]  Date of Birth: [birth year]  Date: 01/27/2024  PCP: No primary care provider on file.  Referral: No ref. provider found  Chief Complaint  Patient presents with   Follow-up    ER 01/23/2024   Subjective:   Caroline Fletcher is a 38 y.o. very pleasant female patient with Body mass index is 41.29 kg/m. who presents with the following:  Perioids are very heavy and irregular.  She was recently at the emergency room for severe diarrhea, and on her blood work she did have a hemoglobin of 10.  She has been talked about this before with gynecology, and she has a large uterus and they were planning on doing a hysterectomy.  Right now she is in transition and she has moved to [place].  She is planning on finding physicians in her new home.  She also has some high cholesterol and she now has a BMI of 41.  She struggled with some of her medications gaining weight for Tourette's, but now she is doing well on Topamax.  Results for orders placed or performed in visit on 01/27/24  Lipid panel   Collection Time: 01/27/24  3:13 PM  Result Value Ref Range   Cholesterol 218 (H) 0 - 200 mg/dL   Triglycerides 440.1 (H) 0.0 - 149.0 mg/dL   HDL 02.72 (L) >53.66 mg/dL   VLDL 44.0 (H) 0.0 - 34.7 mg/dL   LDL Cholesterol 425 (H) 0 - 99 mg/dL   Total CHOL/HDL Ratio 6    NonHDL 183.87   TSH   Collection Time: 01/27/24  3:13 PM  Result Value Ref Range   TSH 2.77 0.35 - 5.50 uIU/mL  T3, free   Collection Time: 01/27/24  3:13 PM  Result Value Ref Range   T3, Free 2.9 2.3 - 4.2 pg/mL  T4, free   Collection Time: 01/27/24  3:13 PM  Result Value Ref Range   Free T4 0.76 0.60 - 1.60 ng/dL  IBC + Ferritin   Collection Time: 01/27/24  3:13 PM  Result Value Ref Range   Iron 19 (L) 42 - 145  ug/dL   Transferrin 956.3 (H) 212.0 - 360.0 mg/dL   Saturation Ratios 3.6 (L) 20.0 - 50.0 %   Ferritin 5.3 (L) 10.0 - 291.0 ng/mL   TIBC 533.4 (H) 250.0 - 450.0 mcg/dL  Basic metabolic panel   Collection Time: 01/27/24  3:13 PM  Result Value Ref Range   Sodium 142 135 - 145 mEq/L   Potassium 3.8 3.5 - 5.1 mEq/L   Chloride 108 96 - 112 mEq/L   CO2 24 19 - 32 mEq/L   Glucose, Bld 98 70 - 99 mg/dL   BUN 10 6 - 23 mg/dL   Creatinine, Ser 8.75 0.40 - 1.20 mg/dL   GFR 643.32 >95.18 mL/min   Calcium 8.8 8.4 - 10.5 mg/dL     Review of Systems is noted in the HPI, as appropriate  Objective:   BP 110/60 (BP Location: Right Arm, Patient Position: Sitting, Cuff Size: Large)   Pulse 86   Temp 98 F (36.7 C) (Temporal)   Ht 5' 2.75" (1.594 m)   Wt 231 lb 4 oz (104.9 kg)   SpO2 97%   BMI 41.29 kg/m   GEN: No acute distress; alert,appropriate. CV: RRR, no  m/g/r  PULM: Normal respiratory rate, no accessory muscle use. No wheezes, crackles or rhonchi  PSYCH: Normally interactive.   Laboratory and Imaging Data:  Assessment and Plan:     ICD-10-CM   1. Other iron deficiency anemia  D50.8 IBC + Ferritin    2. Mixed hyperlipidemia  E78.2 Lipid panel    3. Other specified hypothyroidism  E03.8 TSH    T3, free    T4, free    4. Dehydration  E86.0 Basic metabolic panel     She does have significant iron deficiency anemia with very low levels of iron and ferritin.  I am going to have her start taking iron sulfate 325 mg a day.  I think that this is likely all driven from very heavy menstruation.  Plan is for hysterectomy.  Thyroid levels are normal.  Cholesterol is also elevated with some elevated triglycerides, as well.  Her renal function and electrolytes are now normal.  Medication Management during today's office visit: No orders of the defined types were placed in this encounter.  Medications Discontinued During This Encounter  Medication Reason   topiramate (TOPAMAX) 25  MG tablet Duplicate   loratadine (CLARITIN) 10 MG tablet Duplicate   dicyclomine (BENTYL) 10 MG capsule Duplicate   ondansetron (ZOFRAN-ODT) 4 MG disintegrating tablet Duplicate   loratadine (CLARITIN) 5 MG chewable tablet Duplicate   albuterol (VENTOLIN HFA) 108 (90 Base) MCG/ACT inhaler Completed Course   fluticasone (FLONASE) 50 MCG/ACT nasal spray Completed Course   ondansetron (ZOFRAN-ODT) 4 MG disintegrating tablet Completed Course   dicyclomine (BENTYL) 10 MG capsule Completed Course    Orders placed today for conditions managed today: Orders Placed This Encounter  Procedures   Lipid panel   TSH   T3, free   T4, free   IBC + Ferritin   Basic metabolic panel    Disposition: No follow-ups on file.  [employer] speech-to-text software was used for transcription in this dictation.  Possible transcriptional errors can occur using Animal nutritionist.   Signed,  Elpidio Galea. Zakhai Meisinger, MD   Outpatient Encounter Medications as of 01/27/2024  Medication Sig   ciprofloxacin (CIPRO) 500 MG tablet Take 1 tablet (500 mg total) by mouth 2 (two) times daily for 7 days.   cloNIDine (CATAPRES) 0.1 MG tablet Take 0.1 mg by mouth daily.    escitalopram (LEXAPRO) 20 MG tablet Take 1 tablet by mouth daily.   levothyroxine (SYNTHROID) 25 MCG tablet Take 25 mcg by mouth daily.   loratadine (CLARITIN) 10 MG tablet Take 10 mg by mouth daily.   topiramate (TOPAMAX) 25 MG tablet Take by mouth.   [DISCONTINUED] albuterol (VENTOLIN HFA) 108 (90 Base) MCG/ACT inhaler Inhale 1-2 puffs into the lungs every 6 (six) hours as needed for wheezing or shortness of breath.   [DISCONTINUED] dicyclomine (BENTYL) 10 MG capsule Take 1 capsule (10 mg total) by mouth 3 (three) times daily as needed for spasms (abdominal pain).   [DISCONTINUED] dicyclomine (BENTYL) 10 MG capsule Take 1 capsule (10 mg total) by mouth 3 (three) times daily as needed for spasms (abdominal pain).   [DISCONTINUED] fluticasone (FLONASE)  50 MCG/ACT nasal spray Place 2 sprays into both nostrils daily.   [DISCONTINUED] loratadine (CLARITIN) 10 MG tablet Take 1 tablet by mouth daily.   [DISCONTINUED] loratadine (CLARITIN) 5 MG chewable tablet    [DISCONTINUED] ondansetron (ZOFRAN-ODT) 4 MG disintegrating tablet Allow 1-2 tablets to dissolve in your mouth every 8 hours as needed for nausea/vomiting   [DISCONTINUED] ondansetron (ZOFRAN-ODT)  4 MG disintegrating tablet Allow 1-2 tablets to dissolve in your mouth every 8 hours as needed for nausea/vomiting   [DISCONTINUED] topiramate (TOPAMAX) 25 MG tablet Take by mouth.   No facility-administered encounter medications on file as of 01/27/2024.

## 2024-01-28 ENCOUNTER — Encounter: Payer: Self-pay | Admitting: Family Medicine

## 2024-01-28 ENCOUNTER — Encounter: Payer: Self-pay | Admitting: *Deleted

## 2024-01-28 LAB — BASIC METABOLIC PANEL
BUN: 10 mg/dL (ref 6–23)
CO2: 24 meq/L (ref 19–32)
Calcium: 8.8 mg/dL (ref 8.4–10.5)
Chloride: 108 meq/L (ref 96–112)
Creatinine, Ser: 0.76 mg/dL (ref 0.40–1.20)
GFR: 101.2 mL/min (ref >60.00)
Glucose, Bld: 98 mg/dL (ref 70–99)
Potassium: 3.8 meq/L (ref 3.5–5.1)
Sodium: 142 meq/L (ref 135–145)

## 2024-01-28 LAB — LIPID PANEL
Cholesterol: 218 mg/dL — ABNORMAL HIGH (ref 0–200)
HDL: 34.4 mg/dL — ABNORMAL LOW (ref >39.00)
LDL Cholesterol: 121 mg/dL — ABNORMAL HIGH (ref 0–99)
NonHDL: 183.87
Total CHOL/HDL Ratio: 6
Triglycerides: 313 mg/dL — ABNORMAL HIGH (ref 0.0–149.0)
VLDL: 62.6 mg/dL — ABNORMAL HIGH (ref 0.0–40.0)

## 2024-01-28 LAB — IBC + FERRITIN
Ferritin: 5.3 ng/mL — ABNORMAL LOW (ref 10.0–291.0)
Iron: 19 ug/dL — ABNORMAL LOW (ref 42–145)
Saturation Ratios: 3.6 % — ABNORMAL LOW (ref 20.0–50.0)
TIBC: 533.4 ug/dL — ABNORMAL HIGH (ref 250.0–450.0)
Transferrin: 381 mg/dL — ABNORMAL HIGH (ref 212.0–360.0)

## 2024-01-28 LAB — T3, FREE: T3, Free: 2.9 pg/mL (ref 2.3–4.2)

## 2024-01-28 LAB — TSH: TSH: 2.77 u[IU]/mL (ref 0.35–5.50)

## 2024-01-28 LAB — T4, FREE: Free T4: 0.76 ng/dL (ref 0.60–1.60)
